# Patient Record
Sex: Female | Born: 2000
Health system: Southern US, Community
[De-identification: ages and names within clinical notes are randomized; demographics above are authoritative.]

## PROBLEM LIST (undated history)

## (undated) ENCOUNTER — Emergency Department (HOSPITAL_COMMUNITY): Payer: Self-pay | Source: Home / Self Care

## (undated) DIAGNOSIS — R519 Headache, unspecified: Secondary | ICD-10-CM

## (undated) DIAGNOSIS — F32A Depression, unspecified: Secondary | ICD-10-CM

## (undated) DIAGNOSIS — F938 Other childhood emotional disorders: Secondary | ICD-10-CM

## (undated) DIAGNOSIS — F988 Other specified behavioral and emotional disorders with onset usually occurring in childhood and adolescence: Secondary | ICD-10-CM

## (undated) DIAGNOSIS — J3089 Other allergic rhinitis: Secondary | ICD-10-CM

## (undated) DIAGNOSIS — T7840XA Allergy, unspecified, initial encounter: Secondary | ICD-10-CM

## (undated) DIAGNOSIS — R480 Dyslexia and alexia: Secondary | ICD-10-CM

## (undated) DIAGNOSIS — F329 Major depressive disorder, single episode, unspecified: Secondary | ICD-10-CM

## (undated) DIAGNOSIS — R51 Headache: Secondary | ICD-10-CM

## (undated) DIAGNOSIS — G4700 Insomnia, unspecified: Secondary | ICD-10-CM

## (undated) DIAGNOSIS — J45909 Unspecified asthma, uncomplicated: Secondary | ICD-10-CM

## (undated) DIAGNOSIS — R55 Syncope and collapse: Secondary | ICD-10-CM

## (undated) HISTORY — DX: Unspecified asthma, uncomplicated: J45.909

## (undated) HISTORY — DX: Other allergic rhinitis: J30.89

## (undated) HISTORY — DX: Headache, unspecified: R51.9

## (undated) HISTORY — DX: Headache: R51

## (undated) HISTORY — DX: Dyslexia and alexia: R48.0

## (undated) HISTORY — DX: Syncope and collapse: R55

## (undated) HISTORY — DX: Allergy, unspecified, initial encounter: T78.40XA

## (undated) HISTORY — PX: ADENOIDECTOMY: SHX5191

---
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
## 2019-01-08 ENCOUNTER — Ambulatory Visit (INDEPENDENT_AMBULATORY_CARE_PROVIDER_SITE_OTHER): Payer: PPO | Admitting: Family

## 2019-01-08 ENCOUNTER — Encounter: Payer: Self-pay | Admitting: Family

## 2019-01-08 ENCOUNTER — Other Ambulatory Visit: Payer: Self-pay

## 2019-01-08 DIAGNOSIS — F332 Major depressive disorder, recurrent severe without psychotic features: Secondary | ICD-10-CM

## 2019-01-08 DIAGNOSIS — F411 Generalized anxiety disorder: Secondary | ICD-10-CM

## 2019-01-08 DIAGNOSIS — Z79899 Other long term (current) drug therapy: Secondary | ICD-10-CM

## 2019-01-08 DIAGNOSIS — Z719 Counseling, unspecified: Secondary | ICD-10-CM | POA: Diagnosis not present

## 2019-01-08 DIAGNOSIS — F902 Attention-deficit hyperactivity disorder, combined type: Secondary | ICD-10-CM | POA: Diagnosis not present

## 2019-01-08 DIAGNOSIS — F5104 Psychophysiologic insomnia: Secondary | ICD-10-CM

## 2019-01-08 DIAGNOSIS — Z7189 Other specified counseling: Secondary | ICD-10-CM | POA: Diagnosis not present

## 2019-01-08 MED ORDER — FLUOXETINE HCL 20 MG PO TABS: 20.0000 mg | ORAL_TABLET | Freq: Every day | ORAL | 2 refills | Status: DC

## 2019-01-08 MED ORDER — AMPHETAMINE SULFATE 10 MG PO TABS: 20.0000 mg | ORAL_TABLET | Freq: Two times a day (BID) | ORAL | 0 refills | Status: DC

## 2019-01-08 NOTE — Progress Notes (Signed)
Patient ID: Deborah Cline, female   DOB: 06/06/2001, 18 y.o.   MRN: [id number]  [facility] [address], [place]. [address] [place] [address] Dept: [phone] Dept Fax: [phone]  Medication Check visit via Virtual Video due to COVID-19  Patient ID:  Deborah Cline  female DOB: May 02, 2001   18 y.o.   MRN: [id number]   DATE:01/08/19  PCP: Eliberto Ivory, MD  Virtual Visit via Video Note  I connected with patient @ [phone] patient's cell number  (Name Deborah Cline) on 01/08/19 at  2:00 PM EDT by a video enabled telemedicine application and verified that I am speaking with the correct person using two identifiers.   I discussed the limitations of evaluation and management by telemedicine and the availability of in person appointments. The patient/parent expressed understanding and agreed to proceed.  Parent Location: at home Provider Locations: [address], [place], [place]  HISTORY/CURRENT STATUS: Deborah Cline is here for medication management of the psychoactive medications for ADHD and review of educational and behavioral concerns.   Deborah Cline currently taking Evekeo and Prozac  which is working well. Takes medication at 11-12 pm. Medication tends to wear off around 10 pm. Deborah Cline is able to focus through homework.   Deborah Cline is eating well (eating breakfast, lunch and dinner). Eating has been up and down.   Not Sleeping well (goes to bed at 2 am wakes every few hours and gets up at 11:50 am), irregular sleep pattern.   Deborah Cline denies thoughts of hurting self or others, denies depression, anxiety, or fears. Prozac 20 mg daily,   EDUCATION: School: [employer] Year/Grade: Freshman  Performance/ Grades: average Services: Other: Disability services  Deborah Cline is currently out of school due to social distancing due to COVID-19. Online schooling now and not working due to  limiting her social interactions.   Activities/ Exercise: intermittently-walking, exercise with yoga in the morning  Screen time: (phone, tablet, TV, computer): Increased amount of time watching.   MEDICAL HISTORY: Individual Medical History/ Review of Systems: Changes? :None reported recently  Family Medical/ Social History: Changes? None reported by patient.   Current Medications:  Outpatient Encounter Medications as of 01/08/2019  Medication Sig   albuterol (PROVENTIL) (2.5 MG/3ML) 0.083% nebulizer solution Take 2.5 mg by nebulization every 6 (six) hours as needed for wheezing or shortness of breath.   Amphetamine Sulfate (EVEKEO) 10 MG TABS Take 20 mg by mouth 2 (two) times daily.   fexofenadine-pseudoephedrine (ALLEGRA-D) 60-120 MG 12 hr tablet Take 1 tablet by mouth 2 (two) times daily.   FLUoxetine (PROZAC) 20 MG tablet Take 1 tablet (20 mg total) by mouth daily.   NUVARING 0.12-0.015 MG/24HR vaginal ring USE ONE ring every THREE WEEKS, AND THEN REMOVE FOR ONE WEEK   traZODone (DESYREL) 100 MG tablet TAKE 2 TABLETS (200 MG TOTAL) BY MOUTH AT BEDTIME.   [DISCONTINUED] Amphetamine Sulfate (EVEKEO) 10 MG TABS Take 20 mg by mouth 2 (two) times daily.   [DISCONTINUED] FLUoxetine (PROZAC) 20 MG tablet Take 1 tablet (20 mg total) by mouth daily.   No facility-administered encounter medications on file as of 01/08/2019.    Medication Side Effects: None  MENTAL HEALTH: Mental Health Issues:   Depression and Anxiety-Prozac 20 mg daily with no side effects.   DIAGNOSES:    ICD-10-CM   1. Medication management Z79.899   2. ADHD (attention deficit hyperactivity disorder), combined type F90.2 FLUoxetine (PROZAC) 20 MG tablet  3. Psychophysiological  insomnia F51.04 FLUoxetine (PROZAC) 20 MG tablet  4. Severe episode of recurrent major depressive disorder, without psychotic features (HCC) F33.2 FLUoxetine (PROZAC) 20 MG tablet  5. Patient counseled Z71.9   6. Goals of care,  counseling/discussion Z71.89   7. Generalized anxiety disorder F41.1     RECOMMENDATIONS:  Discussed recent history and updates with patient since last visit today.   Discussed school academic progress and appropriate accommodations as needed for success.   Discussed continued need for routine, structure, motivation, reward and positive reinforcement with school and change in structure.   Encouraged recommended limitations on TV, tablets, phones, video games and computers for non-educational activities.   Encouraged physical activity and outdoor play, maintaining social distancing.   Referred to [employer] for resources about engaging children who are at home in home and online study.    Counseled medication pharmacokinetics, options, dosage, administration, desired effects, and possible side effects.   Evekeo 10 mg BID, # 60 with no RF's, Prozac 20 mg daily, # 30 with 2 RF's and continued Trazodone for sleep with no Rx.   I discussed the assessment and treatment plan with the patient/parent. The patient/parent was provided an opportunity to ask questions and all were answered. The patient/ parent agreed with the plan and demonstrated an understanding of the instructions.   I provided 25 minutes of non-face-to-face time during this encounter. Record review for 10 minutes before virtual video call.   NEXT APPOINTMENT:  Return in about 3 months (around 04/09/2019) for Follow up visit.  The patient/parent was advised to call back or seek an in-person evaluation if the symptoms worsen or if the condition fails to improve as anticipated.  Medical Decision-making: More than 50% of the appointment was spent counseling and discussing diagnosis and management of symptoms with the patient and family.  Carron Curie, NP

## 2019-02-28 IMAGING — MR MR HEAD W/O CM
10 of 12 series · 33 of 48 positions shown · non-contrast
Comparison: None.

CLINICAL DATA: Headache and syncope.  Abnormal EEG

EXAM:
MRI HEAD WITHOUT CONTRAST
TECHNIQUE: Multiplanar, multiecho pulse sequences of the brain and surrounding
structures were obtained without intravenous contrast.

[Series 3: DWI · axial · 3.0mm · 0.94mm/px · z∈[-116,+27]mm · 7 of 100 slices shown (1 of 2)]
[im 1/100]
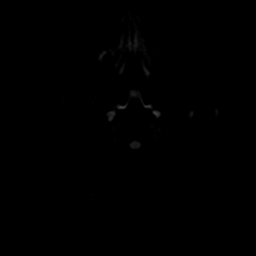
[im 17/100]
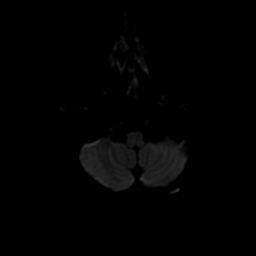
[im 34/100]
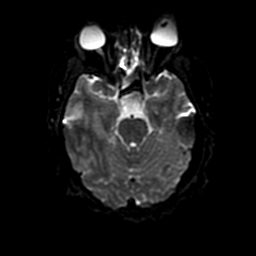
[im 50/100]
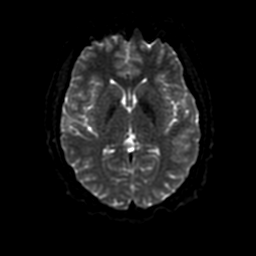
[im 67/100]
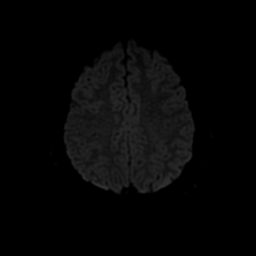
[im 83/100]
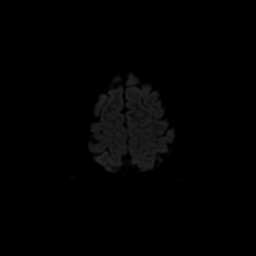
[im 100/100]
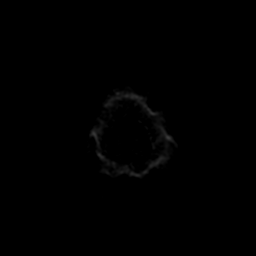

[Series 4: FLAIR · sagittal · 5.0mm · 0.47mm/px · 1 of 23 slices shown (1 of 2)]
[im 1/23]
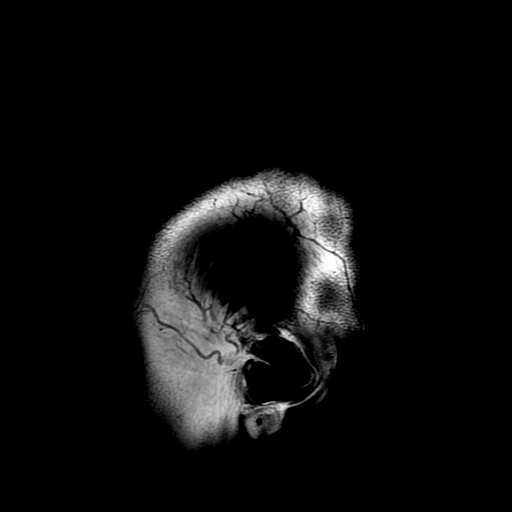

[Series 5: T2 · axial · 5.0mm · 0.47mm/px · z∈[-115,+25]mm · 2 of 25 slices shown (1 of 2)]
[im 1/25]
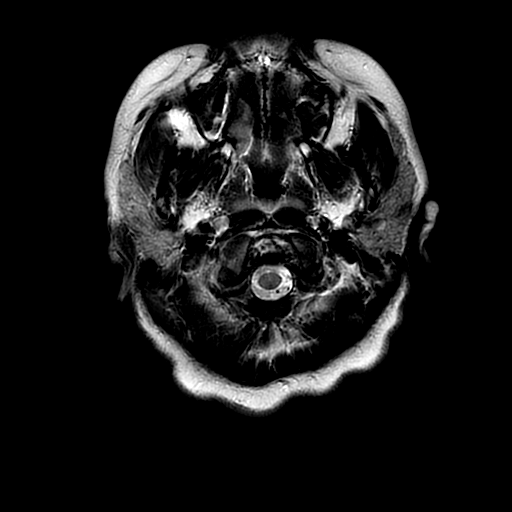
[im 25/25]
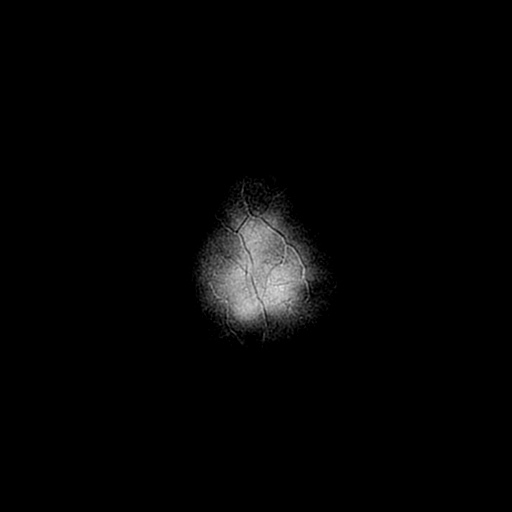

[Series 7: DWI · coronal · 4.0mm · 0.94mm/px · 5 of 72 slices shown (2 of 2)]
[im 1/72]
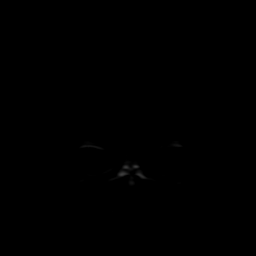
[im 18/72]
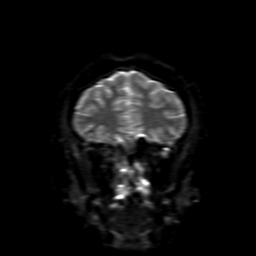
[im 36/72]
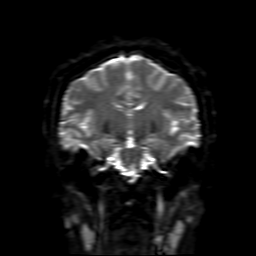
[im 54/72]
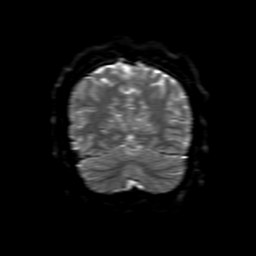
[im 72/72]
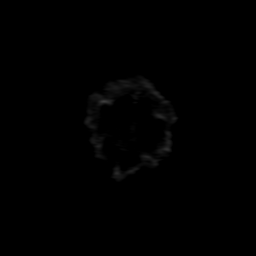

[Series 8: (person_name) · axial · 3.0mm · 0.47mm/px · z∈[-123,-41]mm · 5 of 100 slices shown]
[im 1/100]
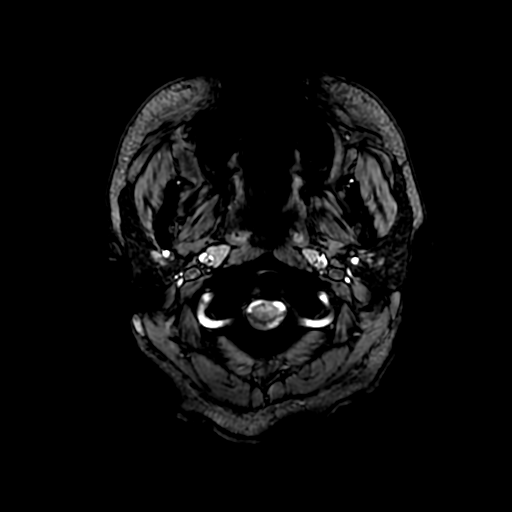
[im 15/100]
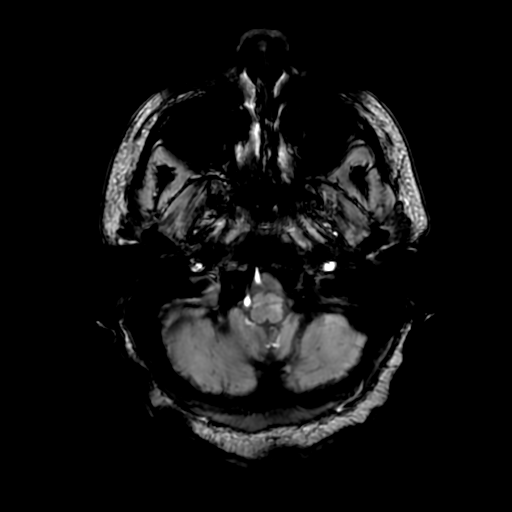
[im 29/100]
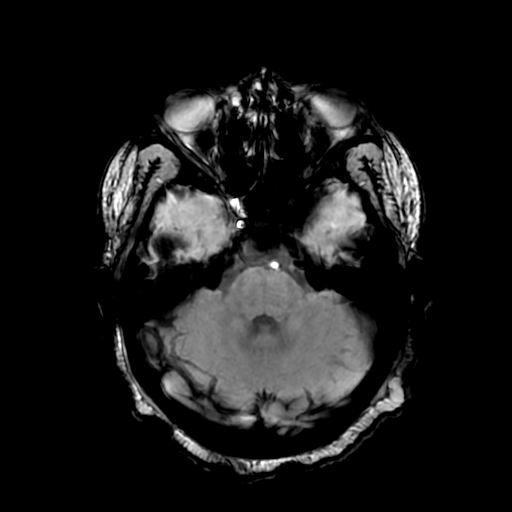
[im 43/100]
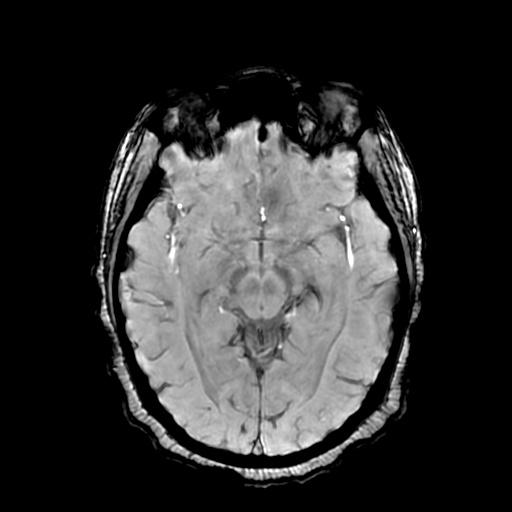
[im 57/100]
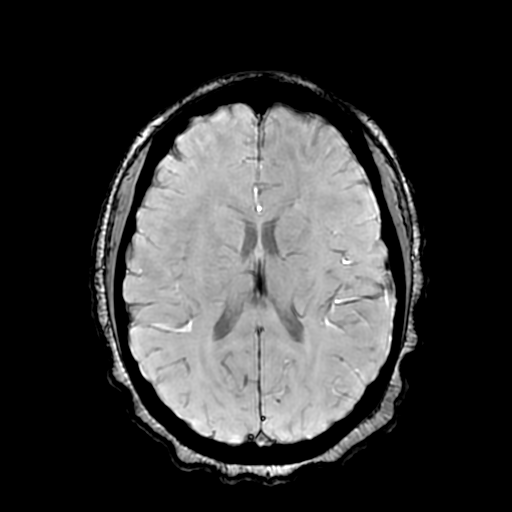

[Series 9: FLAIR · axial · 4.0mm · 0.45mm/px · z∈[-114,+25]mm · 2 of 27 slices shown (2 of 2)]
[im 1/27]
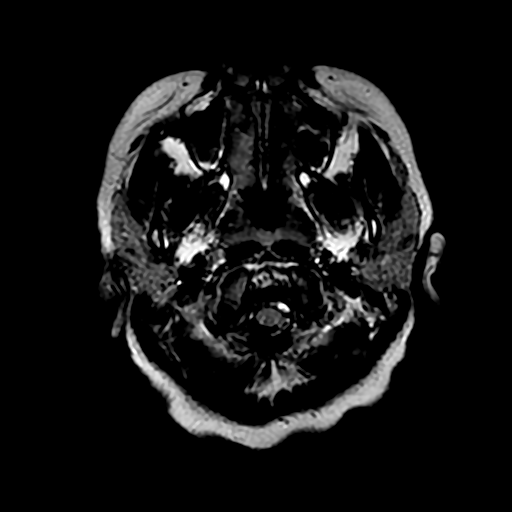
[im 27/27]
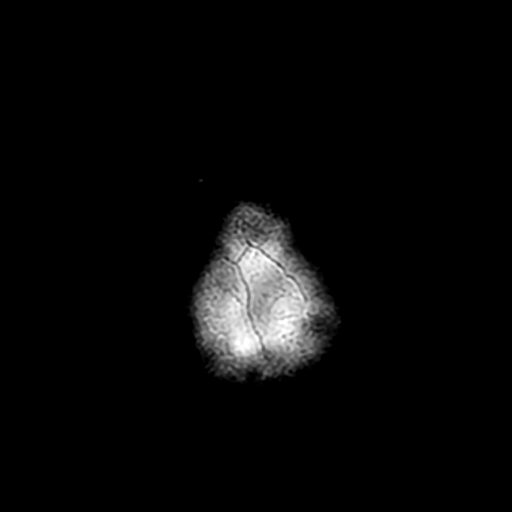

[Series 11: T2 fat-sat · coronal · 3.0mm · 0.43mm/px · 2 of 26 slices shown]
[im 1/26]
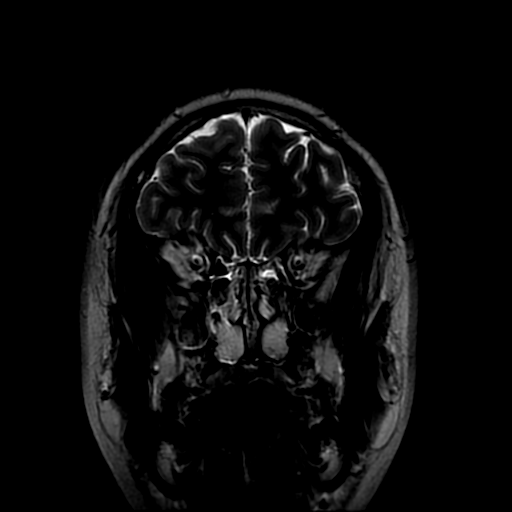
[im 26/26]
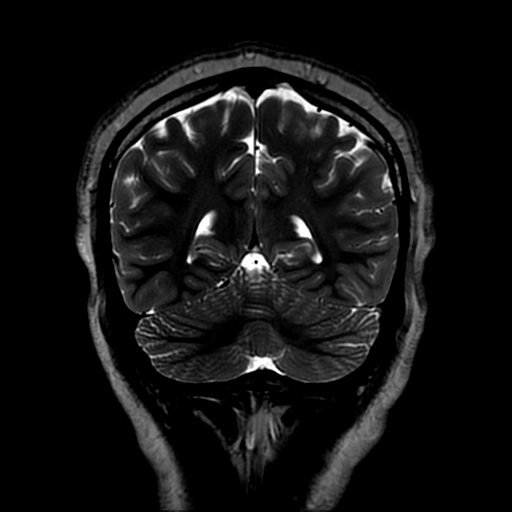

[Series 12: T2 · coronal · 5.0mm · 0.39mm/px · 2 of 30 slices shown (2 of 2)]
[im 1/30]
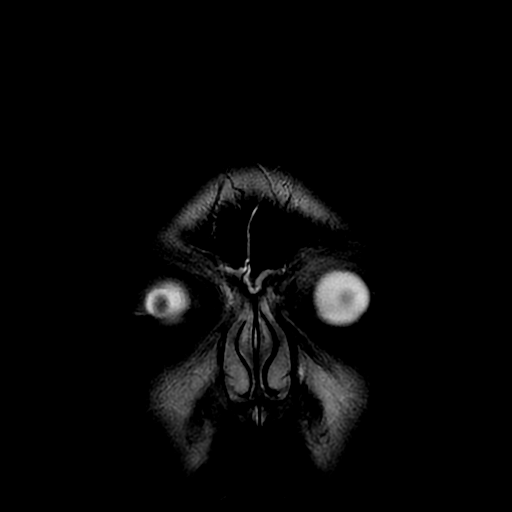
[im 30/30]
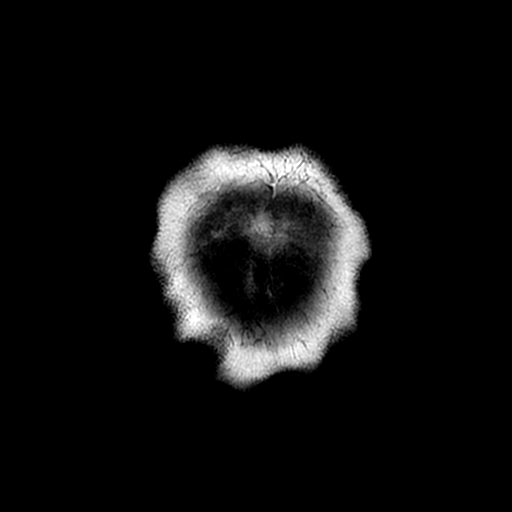

[Series 350: ADC · axial · 3.0mm · 0.94mm/px · z∈[-116,+27]mm · 4 of 50 slices shown (1 of 2)]
[im 1/50]
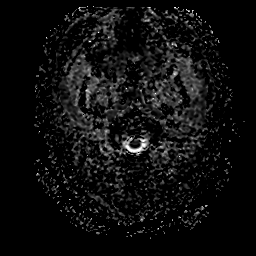
[im 17/50]
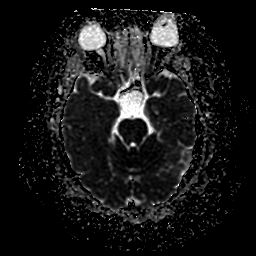
[im 33/50]
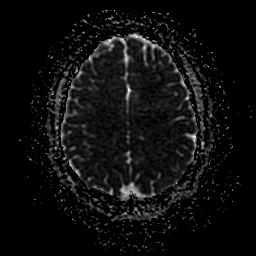
[im 50/50]
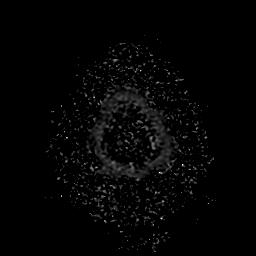

[Series 750: ADC · coronal · 4.0mm · 0.94mm/px · 3 of 36 slices shown (2 of 2)]
[im 1/36]
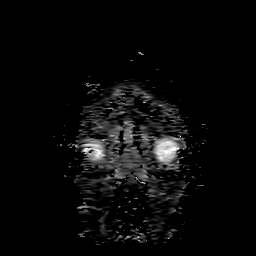
[im 18/36]
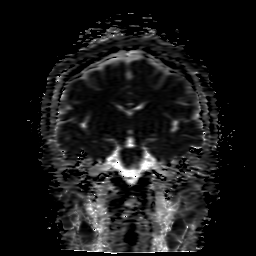
[im 36/36]
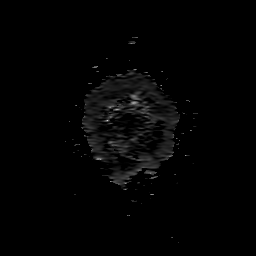

[33 of 48 positions shown; findings below may reference images not displayed]

FINDINGS: Brain: No acute infarction, hemorrhage, hydrocephalus, extra-axial
collection or mass lesion.

Vascular: Normal flow voids

Skull and upper cervical spine: Negative

Sinuses/Orbits: Moderate mucosal edema throughout the paranasal
sinuses without air-fluid level. Normal orbit.

Other: None
IMPRESSION: Normal MRI of the brain

Mucosal edema throughout the paranasal sinuses without air-fluid
level

## 2019-03-20 DIAGNOSIS — Z0100 Encounter for examination of eyes and vision without abnormal findings: Secondary | ICD-10-CM | POA: Diagnosis not present

## 2019-03-26 ENCOUNTER — Ambulatory Visit (INDEPENDENT_AMBULATORY_CARE_PROVIDER_SITE_OTHER): Payer: PPO

## 2019-03-26 ENCOUNTER — Ambulatory Visit (INDEPENDENT_AMBULATORY_CARE_PROVIDER_SITE_OTHER): Payer: PPO | Admitting: Orthopedic Surgery

## 2019-03-26 ENCOUNTER — Encounter: Payer: Self-pay | Admitting: Orthopedic Surgery

## 2019-03-26 ENCOUNTER — Other Ambulatory Visit: Payer: Self-pay

## 2019-03-26 VITALS — Ht 71.0 in | Wt 175.0 lb

## 2019-03-26 DIAGNOSIS — S93621A Sprain of tarsometatarsal ligament of right foot, initial encounter: Secondary | ICD-10-CM | POA: Diagnosis not present

## 2019-03-26 DIAGNOSIS — M79671 Pain in right foot: Secondary | ICD-10-CM | POA: Diagnosis not present

## 2019-03-26 NOTE — Progress Notes (Signed)
Office Visit Note   Patient: Deborah Cline           Date of Birth: January 23, 2001           MRN: [id number] Visit Date: 03/26/2019              Requested by: Elnita Maxwell, MD [address], [address] [place],  [place] [address] PCP: Elnita Maxwell, MD  Chief Complaint  Patient presents with  . Right Foot - Pain    DOI 03/26/2019 kicking punching bag.       HPI: Patient is a 18 year old woman who presents with pain right midfoot.  She states she was kicking a punching bag with her foot plantarflexed striking the middle of her foot she complains of pain when she woke up in the morning injury occurred last night.  Complains of ecchymosis and bruising on the plantar aspect of the midfoot.  Assessment & Plan: Visit Diagnoses:  1. Pain in right foot   2. Lisfranc's sprain, right, initial encounter     Plan: Patient was placed in a fracture boot left a message with her mother that she has a sprain Lisfranc complex and this should resolve with immobilization.  Reevaluate in 3 weeks with three-view radiographs of the right foot.  Follow-Up Instructions: Return in about 3 weeks (around 04/16/2019).   Ortho Exam  Patient is alert, oriented, no adenopathy, well-dressed, normal affect, normal respiratory effort. Examination patient has good pulses she has ecchymosis and bruising on the plantar aspect of the foot.  Distraction across the Lisfranc complex is painful she is tender to palpation over the Lisfranc joint distraction across the metatarsal heads is not tender.  She has good inversion eversion plantarflexion and dorsiflexion strength.  Imaging: Xr Foot 2 Views Right  Result Date: 03/26/2019 2 view radiographs of the right foot shows no evidence of a fracture.  There is no evulsion in the Lisfranc complex no widening no incongruity of the joint spaces.  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  No images are attached to the encounter.  Labs:  No results found for: HGBA1C, ESRSEDRATE, CRP, LABURIC, REPTSTATUS, GRAMSTAIN, CULT, LABORGA   Lab Results  Component Value Date   ALBUMIN 5.0 07/06/2015    Body mass index is 24.41 kg/m.  Orders:  Orders Placed This Encounter  Procedures  . XR Foot 2 Views Right   No orders of the defined types were placed in this encounter.    Procedures: No procedures performed  Clinical Data: No additional findings.  ROS:  All other systems negative, except as noted in the HPI. Review of Systems  Objective: Vital Signs: Ht 5\' 11"  (1.803 m)   Wt 175 lb (79.4 kg)   BMI 24.41 kg/m   Specialty Comments:  No specialty comments available.  PMFS History: Patient Active Problem List   Diagnosis Date Noted  . Abnormal EEG 03/13/2017  . Vasovagal syncope 02/21/2017  . Migraine without aura and without status migrainosus, not intractable 02/21/2017  . Achilles tendinitis, left leg 08/23/2016  . ADHD (attention deficit hyperactivity disorder), combined type 08/17/2015  . MDD (major depressive disorder), recurrent episode, severe ([place]) 07/07/2015  . 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 Insomnia 07/07/2015   Past Medical History:  Diagnosis Date  . ADD (attention deficit disorder)   . 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 Asthma   . Depression   . Dyslexia   . Environmental and seasonal allergies   . Headache   .  Insomnia 07/07/2015  . Syncope and collapse     Family History  Problem Relation Age of Onset  . Mental illness Maternal Aunt   . Depression Mother     Past Surgical History:  Procedure Laterality Date  . ADENOIDECTOMY     Social History   Occupational History  . Not on file  Tobacco Use  . Smoking status: Never Smoker  . Smokeless tobacco: Never Used  Substance and Sexual Activity  . Alcohol use: No  . Drug use: No  . Sexual activity: Never    Partners: Male    Birth control/protection: Other-see comments    Comment: Nexplanon

## 2019-03-31 ENCOUNTER — Other Ambulatory Visit: Payer: Self-pay

## 2019-03-31 ENCOUNTER — Ambulatory Visit (INDEPENDENT_AMBULATORY_CARE_PROVIDER_SITE_OTHER): Payer: 59 | Admitting: Family

## 2019-03-31 ENCOUNTER — Encounter: Payer: Self-pay | Admitting: Family

## 2019-03-31 DIAGNOSIS — Z719 Counseling, unspecified: Secondary | ICD-10-CM | POA: Diagnosis not present

## 2019-03-31 DIAGNOSIS — F902 Attention-deficit hyperactivity disorder, combined type: Secondary | ICD-10-CM | POA: Diagnosis not present

## 2019-03-31 DIAGNOSIS — F332 Major depressive disorder, recurrent severe without psychotic features: Secondary | ICD-10-CM

## 2019-03-31 DIAGNOSIS — Z8659 Personal history of other mental and behavioral disorders: Secondary | ICD-10-CM

## 2019-03-31 DIAGNOSIS — Z79899 Other long term (current) drug therapy: Secondary | ICD-10-CM | POA: Diagnosis not present

## 2019-03-31 DIAGNOSIS — F5104 Psychophysiologic insomnia: Secondary | ICD-10-CM | POA: Diagnosis not present

## 2019-03-31 DIAGNOSIS — G43009 Migraine without aura, not intractable, without status migrainosus: Secondary | ICD-10-CM | POA: Diagnosis not present

## 2019-03-31 MED ORDER — FLUOXETINE HCL 20 MG PO TABS: 20.0000 mg | ORAL_TABLET | Freq: Every day | ORAL | 2 refills | Status: DC

## 2019-03-31 NOTE — Progress Notes (Signed)
[facility]. [address] [place] [address] Dept: [phone] Dept Fax: [phone]  Medication Check visit via Virtual Video due to COVID-19  Patient ID:  Deborah Cline  female DOB: [birth year]   18 y.o.   MRN: [id number]   DATE:03/31/19  PCP: Elnita Maxwell, MD   Virtual Visit via Telephone Note Contacted  Joleen Stuckert on 03/31/19 at  3:00 PM EDT by telephone and verified that I am speaking with the correct person using two identifiers. Patient Location: at home   I discussed the limitations, risks, security and privacy concerns of performing an evaluation and management service by telephone and the availability of in person appointments. I also discussed with the parents that there may be a patient responsible charge related to this service. The parents expressed understanding and agreed to proceed.  Provider: Carolann Littler, NP  Location: work location  HISTORY/CURRENT STATUS: Deborah Cline is here for medication management of the psychoactive medications for ADHD and review of educational and behavioral concerns.   Arnelle currently taking Evekeo and Prozac, which is working well. Takes medication when she wakes up about 10-1:00 pm. Medication tends to wear off around 9-10:00 pm. Keali is able to focus through school/homework.   Eh is eating well (eating breakfast, lunch and dinner).   Sleeping well (goes to bed some time after 1:00 am wakes at 10-1:00 am), sleeping through the night using Trazadone.   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   Activities/ Exercise: intermittently-some walking in the neighborhood.  Screen time: (phone, tablet, TV, computer):  TV, computer, movies, and phone.  MEDICAL HISTORY: Individual Medical History/ Review of Systems: Changes? :None recently  Family Medical/ Social History: Changes? None reported recently Patient Lives with: mother and stepfather  Current Medications:  Outpatient Encounter Medications as of 03/31/2019  Medication Sig   albuterol (PROVENTIL) (2.5 MG/3ML) 0.083% nebulizer solution Take 2.5 mg by nebulization every 6 (six) hours as needed for wheezing or shortness of breath.   Amphetamine Sulfate (EVEKEO) 10 MG TABS Take 20 mg by mouth 2 (two) times daily.   fexofenadine-pseudoephedrine (ALLEGRA-D) 60-120 MG 12 hr tablet Take 1 tablet by mouth 2 (two) times daily.   FLUoxetine (PROZAC) 20 MG tablet Take 1-2 tablets (20-40 mg total) by mouth daily.   NUVARING 0.12-0.015 MG/24HR vaginal ring USE ONE ring every THREE WEEKS, AND THEN REMOVE FOR ONE WEEK   traZODone (DESYREL) 100 MG tablet TAKE 2 TABLETS (200 MG TOTAL) BY MOUTH AT BEDTIME.   [DISCONTINUED] FLUoxetine (PROZAC) 20 MG tablet Take 1 tablet (20 mg total) by mouth daily.   No facility-administered encounter medications on file as of 03/31/2019.    Medication Side Effects: None  MENTAL HEALTH: Mental Health Issues:   Depression and Anxiety-Prozac  DIAGNOSES:    ICD-10-CM   1. ADHD (attention deficit hyperactivity disorder), combined type  F90.2 FLUoxetine (PROZAC) 20 MG tablet  2. Psychophysiological insomnia  F51.04 FLUoxetine (PROZAC) 20 MG tablet  3. Severe episode of recurrent major depressive disorder, without psychotic features ([place])  F33.2 FLUoxetine (PROZAC) 20 MG tablet  4. Medication management  Z79.899   5. Patient counseled  Z71.9   6. History of anxiety  Z86.59   7. Migraine without aura and without status migrainosus, not intractable  G43.009  RECOMMENDATIONS:  Discussed recent history with patient with updates for learning, school and work with patient since last f/u visit.   Discussed school academic  progress and recommended continued summer academic home school activities.   Discussed continued need for routine, structure, motivation, reward and positive reinforcement with home and school environments.   Encouraged recommended limitations on TV, tablets, phones, video games and computers for non-educational activities.   Discussed need for bedtime routine, use of good sleep hygiene, no video games, TV or phones for an hour before bedtime.   Encouraged physical activity and outdoor play, maintaining social distancing.   Counseled medication pharmacokinetics, options, dosage, administration, desired effects, and possible side effects.   Prozac to increase from 20 mg tablets to 30 mg and then 40 mg if needed, # 60 with 2 RF's. Trazadone to continue with no RF's and Evekeo with no Rx today. RX for above e-scribed and sent to pharmacy on record  [id number] [place] - [address] [place] [id number] Phone: [phone] Fax: [phone]  I discussed the assessment and treatment plan with the patient. The patient was provided an opportunity to ask questions and all were answered. The patient agreed with the plan and demonstrated an understanding of the instructions.   I provided 35 minutes of non-face-to-face time during this encounter. Completed record review for 10 minutes prior to the virtual telemedicine visit.   NEXT APPOINTMENT:  Return in about 3 months (around 07/01/2019) for follow up visit.  The patient was advised to call back or seek an in-person evaluation if the symptoms worsen or if the condition fails to improve as anticipated.  Medical Decision-making: More than 50% of the appointment was spent counseling and discussing diagnosis and management of symptoms with the patient and family.  Carron Curieawn M Paretta-Leahey, NP

## 2019-05-03 ENCOUNTER — Other Ambulatory Visit: Payer: Self-pay | Admitting: Family

## 2019-05-04 NOTE — Telephone Encounter (Signed)
Trazadone 100 mg 2 daily, # 60 with 2 RF's. RX for above e-scribed and sent to pharmacy on record  [id number] [place], [place] - [address] [place] [address] Phone: [phone] Fax: [phone]

## 2019-07-06 ENCOUNTER — Ambulatory Visit (INDEPENDENT_AMBULATORY_CARE_PROVIDER_SITE_OTHER): Payer: Medicaid Other | Admitting: Family

## 2019-07-06 ENCOUNTER — Encounter: Payer: Self-pay | Admitting: Family

## 2019-07-06 DIAGNOSIS — Z719 Counseling, unspecified: Secondary | ICD-10-CM

## 2019-07-06 DIAGNOSIS — F411 Generalized anxiety disorder: Secondary | ICD-10-CM | POA: Diagnosis not present

## 2019-07-06 DIAGNOSIS — G43009 Migraine without aura, not intractable, without status migrainosus: Secondary | ICD-10-CM

## 2019-07-06 DIAGNOSIS — F332 Major depressive disorder, recurrent severe without psychotic features: Secondary | ICD-10-CM

## 2019-07-06 DIAGNOSIS — F902 Attention-deficit hyperactivity disorder, combined type: Secondary | ICD-10-CM | POA: Diagnosis not present

## 2019-07-06 DIAGNOSIS — F5104 Psychophysiologic insomnia: Secondary | ICD-10-CM

## 2019-07-06 DIAGNOSIS — Z79899 Other long term (current) drug therapy: Secondary | ICD-10-CM | POA: Diagnosis not present

## 2019-07-06 MED ORDER — TRAZODONE HCL 100 MG PO TABS: 200.0000 mg | ORAL_TABLET | Freq: Every day | ORAL | 2 refills | Status: DC

## 2019-07-06 MED ORDER — FLUOXETINE HCL 20 MG PO TABS: 20.0000 mg | ORAL_TABLET | Freq: Every day | ORAL | 2 refills | Status: DC

## 2019-07-06 NOTE — Progress Notes (Signed)
[facility] [place] [address] Dept: [phone] Dept Fax: [phone]  Medication Check visit via Virtual Video due to COVID-19  Patient ID:  Deborah Cline  female DOB: 2001-05-12   18 y.o.   MRN: [id number]   DATE:07/06/19  PCP: Elnita Maxwell, MD  Virtual Visit via Video Note  I connected with  Gwynn Burly on 07/06/19 at 10:00 AM EDT by a video enabled telemedicine application and verified that I am speaking with the correct person using two identifiers. Patient/Parent Location: at her friend's house   I discussed the limitations, risks, security and privacy concerns of performing an evaluation and management service by telephone and the availability of in person appointments. I also discussed with the parents that there may be a patient responsible charge related to this service. The parents expressed understanding and agreed to proceed.  Provider: Carolann Littler, NP  Location: private location  HISTORY/CURRENT STATUS: Deborah Cline is here for medication management of the psychoactive medications for ADHD and review of educational and behavioral concerns.   Deborah Cline currently taking Trazadone and Prozac,  which is working well. Takes medication as directed. Medication tends to last for the time needed. [name] is able to focus through work.   Deborah Cline is eating well (eating breakfast, lunch and dinner). Eating well with no issues, but recently had changes in appetite with 3 loses in a short period of time.    Sleeping well (goes to bed at 12:00 am wakes at 2:00 pm), sleeping through the night. Waking at different times of the night and stays up for a while then will go back to sleep. Staying in bed most of the day with nothing to do for the day.   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   Activities/ Exercise: limited exercise.   Screen time: (phone, tablet, TV, computer): phone, TV, movies, computer.  MEDICAL HISTORY: Individual Medical History/ Review of Systems: Changes? :No, none recently reported. Needs new PCP, and dental visit recently.   Family Medical/ Social History: Changes? None reported recently Patient Lives with: mother and stepfather, visits father who lives in [place].  Current Medications:  Outpatient Encounter Medications as of 07/06/2019  Medication Sig  . albuterol (PROVENTIL) (2.5 MG/3ML) 0.083% nebulizer solution Take 2.5 mg by nebulization every 6 (six) hours as needed for wheezing or shortness of breath.  . Amphetamine Sulfate (EVEKEO) 10 MG TABS Take 20 mg by mouth 2 (two) times daily.  . fexofenadine-pseudoephedrine (ALLEGRA-D) 60-120 MG 12 hr tablet Take 1 tablet by mouth 2 (two) times daily.  Marland Kitchen FLUoxetine (PROZAC) 20 MG tablet Take 1-2 tablets (20-40 mg total) by mouth daily.  Marland Kitchen NUVARING 0.12-0.015 MG/24HR vaginal ring USE ONE ring every THREE WEEKS, AND THEN REMOVE FOR ONE WEEK  . traZODone (DESYREL) 100 MG tablet Take 2 tablets (200 mg total) by mouth at bedtime.  . [DISCONTINUED] FLUoxetine (PROZAC) 20 MG tablet Take 1-2 tablets (20-40 mg total) by mouth daily.  . [DISCONTINUED] traZODone (DESYREL) 100 MG tablet TAKE 2 TABLETS (200 MG TOTAL) BY MOUTH AT BEDTIME.   No facility-administered encounter medications on file as of 07/06/2019.    Medication Side Effects: None  MENTAL HEALTH: Mental Health  Issues:   Depression and Anxiety  Better with medication.   DIAGNOSES:    ICD-10-CM   1. ADHD (attention deficit hyperactivity disorder), combined type  F90.2 FLUoxetine (PROZAC) 20 MG tablet  2. Psychophysiological insomnia  F51.04  FLUoxetine (PROZAC) 20 MG tablet  3. Severe episode of recurrent major depressive disorder, without psychotic features (HCC)  F33.2 FLUoxetine (PROZAC) 20 MG tablet  4. Migraine without aura and without status migrainosus, not intractable  G43.009   5. Generalized anxiety disorder  F41.1   6. Medication management  Z79.899   7. Patient counseled  Z71.9     RECOMMENDATIONS:  Discussed recent history with patient with updates for school, learning, health, recent loses in the family, and medications.   Discussed school academic progress and recommended continued accommodations for the new school year. To restart school next semester at [employer].   Referred to ADDitudemag.com for resources about using distance learning with children with ADHD with learning support.   Children and young adults with ADHD often suffer from disorganization, difficulty with time management, completing projects and other executive function difficulties.  Recommended Reading: "Smart but Scattered" and "Smart but Scattered Teens" by Peg Arita Miss and Marjo Bicker.    Discussed continued need for structure, routine, reward (external), motivation (internal), positive reinforcement, consequences, and organization with home and school life balance.   Encouraged recommended limitations on TV, tablets, phones, video games and computers for non-educational activities.   Discussed need for bedtime routine, use of good sleep hygiene, no video games, TV or phones for an hour before bedtime.   Encouraged physical activity and outdoor play, maintaining social distancing.   Counseled medication pharmacokinetics, options, dosage, administration, desired effects, and possible side effects.   Trazadone 100 mg 2 at HS, # 60 with 2 RF's Prozac 20 mg 2 daily, # 60 with no RF's RX for above e-scribed and sent to pharmacy on record  [id number] [place], [place] - [address] [place] [address] Phone:  [phone] Fax: [phone]  I discussed the assessment and treatment plan with the patient. The patient was provided an opportunity to ask questions and all were answered. The patient agreed with the plan and demonstrated an understanding of the instructions.   I provided 35 minutes of non-face-to-face time during this encounter. Completed record review for 10 minutes prior to the virtual video visit.   NEXT APPOINTMENT:  Return in about 3 months (around 10/05/2019) for follow up visit in the office.  The patient was advised to call back or seek an in-person evaluation if the symptoms worsen or if the condition fails to improve as anticipated.  Medical Decision-making: More than 50% of the appointment was spent counseling and discussing diagnosis and management of symptoms with the patient and family.  Carron Curie, NP

## 2019-08-28 ENCOUNTER — Other Ambulatory Visit: Payer: Self-pay

## 2019-08-28 DIAGNOSIS — Z20822 Contact with and (suspected) exposure to covid-19: Secondary | ICD-10-CM

## 2019-08-28 DIAGNOSIS — Z20828 Contact with and (suspected) exposure to other viral communicable diseases: Secondary | ICD-10-CM | POA: Diagnosis not present

## 2019-08-31 LAB — NOVEL CORONAVIRUS, NAA: SARS-CoV-2, NAA: NOT DETECTED

## 2019-09-16 ENCOUNTER — Ambulatory Visit (INDEPENDENT_AMBULATORY_CARE_PROVIDER_SITE_OTHER): Payer: Medicaid Other | Admitting: Certified Nurse Midwife

## 2019-09-16 ENCOUNTER — Other Ambulatory Visit (HOSPITAL_COMMUNITY)
Admission: RE | Admit: 2019-09-16 | Discharge: 2019-09-16 | Disposition: A | Discharge status: Home / Self Care | Payer: Medicaid Other | Source: Ambulatory Visit | Attending: Obstetrics and Gynecology | Admitting: Obstetrics and Gynecology

## 2019-09-16 ENCOUNTER — Other Ambulatory Visit: Payer: Self-pay

## 2019-09-16 ENCOUNTER — Encounter: Payer: Self-pay | Admitting: Certified Nurse Midwife

## 2019-09-16 ENCOUNTER — Telehealth: Payer: Self-pay

## 2019-09-16 VITALS — BP 112/68 | HR 90 | Temp 98.0°F | Ht 71.0 in | Wt 169.6 lb

## 2019-09-16 DIAGNOSIS — Z3049 Encounter for surveillance of other contraceptives: Secondary | ICD-10-CM

## 2019-09-16 DIAGNOSIS — Z23 Encounter for immunization: Secondary | ICD-10-CM | POA: Diagnosis not present

## 2019-09-16 DIAGNOSIS — R102 Pelvic and perineal pain: Secondary | ICD-10-CM

## 2019-09-16 DIAGNOSIS — Z0000 Encounter for general adult medical examination without abnormal findings: Secondary | ICD-10-CM | POA: Diagnosis not present

## 2019-09-16 DIAGNOSIS — Z01419 Encounter for gynecological examination (general) (routine) without abnormal findings: Secondary | ICD-10-CM

## 2019-09-16 DIAGNOSIS — Z113 Encounter for screening for infections with a predominantly sexual mode of transmission: Secondary | ICD-10-CM | POA: Insufficient documentation

## 2019-09-16 MED ORDER — NUVARING 0.12-0.015 MG/24HR VA RING: VAGINAL_RING | VAGINAL | 3 refills | Status: DC

## 2019-09-16 NOTE — Patient Instructions (Signed)
Safe Sex Practicing safe sex means taking steps before and during sex to reduce your risk of:  Getting an STI (sexually transmitted infection).  Giving your partner an STI.  Unwanted or unplanned pregnancy.  Ways you can practice safe sex  Limit your sexual partners to only one partner who is having sex with only you.  Avoid using alcohol and drugs before having sex. Alcohol and drugs can affect your judgment.  Before having sex with a new partner: ? Talk to your partner about past partners, past STIs, and drug use. ? Get screened for STIs and discuss the results with your partner. Ask your partner to get screened, too.  Check your body regularly for sores, blisters, rashes, or unusual discharge. If you notice any of these problems, visit your health care provider.  Avoid sexual contact if you have symptoms of an infection or you are being treated for an STI.  While having sex, use a condom. Make sure to: ? Use a condom every time you have vaginal, oral, or anal sex. Both females and males should wear condoms during oral sex. ? Keep condoms in place from the beginning to the end of sexual activity. ? Use a latex condom, if possible. Latex condoms offer the best protection. ? Use only water-based lubricants with a condom. Using petroleum-based lubricants or oils will weaken the condom and increase the chance that it will break. Ways your health care provider can help you practice safe sex  See your health care provider for regular screenings, exams, and tests for STIs.  Talk with your health care provider about what kind of birth control (contraception) is best for you.  Get vaccinated against hepatitis B and human papillomavirus (HPV).  If you are at risk of being infected with HIV (human immunodeficiency virus), talk with your health care provider about taking a prescription medicine to prevent HIV infection. You are at risk for HIV if you: ? Are a man who has sex with other  men. ? Are sexually active with more than one partner. ? Take drugs by injection. ? Have a sex partner who has HIV. ? Have unprotected sex. ? Have sex with someone who has sex with both men and women. ? Have had an STI. Follow these instructions at home:  Take over-the-counter and prescription medicines as told by your health care provider.  Keep all follow-up visits as told by your health care provider. This is important. Where to find more information  Centers for Disease Control and Prevention: PinkCheek.be  Planned Parenthood: https://www.plannedparenthood.org/  Office on Women's Health: BasketballVoice.it Summary  Practicing safe sex means taking steps before and during sex to reduce your risk of STIs, giving your partner STIs, and having an unwanted or unplanned pregnancy.  Before having sex with a new partner, talk to your partner about past partners, past STIs, and drug use.  Use a condom every time you have vaginal, oral, or anal sex. Both females and males should wear condoms during oral sex.  Check your body regularly for sores, blisters, rashes, or unusual discharge. If you notice any of these problems, visit your health care provider.  See your health care provider for regular screenings, exams, and tests for STIs. This information is not intended to replace advice given to you by your health care provider. Make sure you discuss any questions you have with your health care provider. Document Released: 11/01/2004 Document Revised: 01/16/2019 Document Reviewed: 07/07/2018 Elsevier Patient Education  2020 [employer].

## 2019-09-16 NOTE — Progress Notes (Signed)
GYNECOLOGY ANNUAL PREVENTATIVE CARE ENCOUNTER NOTE  History:     Deborah Cline is a 18 y.o. G0P0000 female here for a routine annual gynecologic exam.  Current complaints: abdominal pain in pelvis that radiates up to left side near umbilicus.   Denies abnormal vaginal bleeding, discharge, problems with intercourse or other gynecologic concerns. Patient request screening for STDs.    Gynecologic History Patient's last menstrual period was 09/09/2019 (approximate). Contraception: NuvaRing vaginal inserts Last Pap: <21yo no pap needed at this time   Obstetric History OB History  Gravida Para Term Preterm AB Living  0 0 0 0 0 0  SAB TAB Ectopic Multiple Live Births  0 0 0 0 0  Obstetric Comments  Per patient, she does not interact with people very often.     Past Medical History:  Diagnosis Date  . ADD (attention deficit disorder)   . 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 Asthma   . Depression   . Dyslexia   . Environmental and seasonal allergies   . Headache   . Insomnia 07/07/2015  . Syncope and collapse     Past Surgical History:  Procedure Laterality Date  . ADENOIDECTOMY      Current Outpatient Medications on File Prior to Visit  Medication Sig Dispense Refill  . albuterol (PROVENTIL) (2.5 MG/3ML) 0.083% nebulizer solution Take 2.5 mg by nebulization every 6 (six) hours as needed for wheezing or shortness of breath.    . Amphetamine Sulfate (EVEKEO) 10 MG TABS Take 20 mg by mouth 2 (two) times daily. 120 tablet 0  . fexofenadine-pseudoephedrine (ALLEGRA-D) 60-120 MG 12 hr tablet Take 1 tablet by mouth 2 (two) times daily. 30 tablet 0  . FLUoxetine (PROZAC) 20 MG tablet Take 1-2 tablets (20-40 mg total) by mouth daily. 60 tablet 2  . traZODone (DESYREL) 100 MG tablet Take 2 tablets (200 mg total) by mouth at bedtime. 60 tablet 2   No current facility-administered medications on file prior to visit.     Allergies  Allergen Reactions  . Cantaloupe  (Diagnostic)   . Fish Allergy   . Peanut Butter Flavor     Social History:  reports that she has never smoked. She has never used smokeless tobacco. She reports that she does not drink alcohol or use drugs.  Family History  Problem Relation Age of Onset  . Mental illness Maternal Aunt   . Depression Mother     The following portions of the patient's history were reviewed and updated as appropriate: allergies, current medications, past family history, past medical history, past social history, past surgical history and problem list.  Review of Systems Pertinent items noted in HPI and remainder of comprehensive ROS otherwise negative.  Physical Exam:  BP 112/68 (BP Location: Right Arm, Patient Position: Sitting, Cuff Size: Normal)   Pulse 90   Temp 98 F (36.7 C) (Oral)   Ht 5\' 11"  (1.803 m)   Wt 169 lb 9.6 oz (76.9 kg)   LMP 09/09/2019 (Approximate)   BMI 23.65 kg/m  CONSTITUTIONAL: Well-developed, well-nourished female in no acute distress.  HENT:  Normocephalic, atraumatic, External right and left ear normal. Oropharynx is clear and moist EYES: Conjunctivae and EOM are normal. Pupils are equal, round, and reactive to light.  NECK: Normal range of motion, supple, no masses.  Normal thyroid.  SKIN: Skin is warm and dry. No rash noted. Not diaphoretic. No erythema. No pallor. MUSCULOSKELETAL: Normal range of motion. No tenderness.  No cyanosis, clubbing,  or edema.  2+ distal pulses. NEUROLOGIC: Alert and oriented to person, place, and time. Normal reflexes, muscle tone coordination.  PSYCHIATRIC: Normal mood and affect. Normal behavior. Normal judgment and thought content. CARDIOVASCULAR: Normal heart rate noted, regular rhythm RESPIRATORY: Clear to auscultation bilaterally. Effort and breath sounds normal, no problems with respiration noted. BREASTS: Symmetric in size. No masses, tenderness, skin changes, nipple drainage, or lymphadenopathy bilaterally. ABDOMEN: Soft, no  distention noted.  No tenderness, rebound or guarding.  PELVIC: Normal appearing external genitalia and urethral meatus;  No abnormal discharge noted.  Normal uterine size, no other palpable masses, no uterine or right adnexal tenderness. Left adnexal tenderness and pain present with palpation.    Assessment and Plan:    1. Women's annual routine gynecological examination - Abnormal well woman examination d/t finding of left adnexal tenderness and pain   2. Screening for STDs (sexually transmitted diseases) - Patient request screening for STDs - Cervicovaginal ancillary only( [place]) - HIV antibody (with reflex) - RPR - Hepatitis B Surface AntiGEN - Hepatitis C Antibody  3. Adnexal pain - Most likely ovarian cyst cause of adnexal pain and tenderness d/t method of birth control  - Patient reports pain is specific to left lower quadrant  - Patient denies having a [place] in the past for assessment, reports she was told she needed one at last annual but it did not get scheduled  - US Pelvis Complete; Future  4. Encounter for surveillance of nuvaring - No complaints or concerns with nuvaring - refill for one year sent to pharmacy  - NUVARING 0.12-0.015 MG/24HR vaginal ring; USE ONE ring every THREE WEEKS, AND THEN REMOVE FOR ONE WEEK  Dispense: 3 each; Refill: 3  5. Need for immunization against influenza - Flu Vaccine QUAD 36+ mos IM  Will follow up results of STD screening/US and manage accordingly. Routine preventative health maintenance measures emphasized. Please refer to After Visit Summary for other counseling recommendations.      Sharyon Cable, CNM [facility] [employer], [facility]

## 2019-09-16 NOTE — Telephone Encounter (Signed)
Tried to reach patient on 10/1 at 2:42pm, 10/14 at 1pm, 11/30 at 9:05am, 12/7 at 10:20am, and 12/9 at 12:02pm with no response to schedule 3 month follow up

## 2019-09-17 ENCOUNTER — Ambulatory Visit: Payer: Medicaid Other | Admitting: Obstetrics and Gynecology

## 2019-09-17 LAB — HIV ANTIBODY (ROUTINE TESTING W REFLEX): HIV Screen 4th Generation wRfx: NONREACTIVE

## 2019-09-17 LAB — RPR: RPR Ser Ql: NONREACTIVE

## 2019-09-17 LAB — HEPATITIS B SURFACE ANTIGEN: Hepatitis B Surface Ag: NEGATIVE

## 2019-09-17 LAB — HEPATITIS C ANTIBODY: Hep C Virus Ab: <0.1 s/co ratio (ref 0.0–0.9)

## 2019-09-17 NOTE — Progress Notes (Signed)
Subjective: Deborah Cline is a G0P0000 at [employer] who presents to the [facility] today for gyn visit. She does not have a history of any mental health concerns, however self reports ADHD diagnosis. She is currently sexually active. She is currently using nuvaring for birth control. Patient states mother as her support system.   BP 112/68 (BP Location: Right Arm, Patient Position: Sitting, Cuff Size: Normal)   Pulse 90   Temp 98 F (36.7 C) (Oral)   Ht 5\' 11"  (1.803 m)   Wt 169 lb 9.6 oz (76.9 kg)   LMP 09/09/2019 (Approximate)   BMI 23.65 kg/m   Birth Control History:  Nuvaring  MDM Patient counseled on all options for birth control today including LARC. Patient desires nuvaring initiated for birth control.   Assessment:  18 y.o. female considering nuvaring for birth control  Plan: No further plan   Lynnea Ferrier, LCSW 09/17/2019 11:02 AM

## 2019-09-18 LAB — CERVICOVAGINAL ANCILLARY ONLY
Chlamydia: NEGATIVE
Comment: NEGATIVE
Comment: NEGATIVE
Comment: NORMAL
Neisseria Gonorrhea: NEGATIVE
Trichomonas: NEGATIVE

## 2019-09-24 ENCOUNTER — Ambulatory Visit (HOSPITAL_COMMUNITY): Admission: RE | Admit: 2019-09-24 | Payer: Medicaid Other | Source: Ambulatory Visit

## 2019-10-27 ENCOUNTER — Ambulatory Visit (HOSPITAL_COMMUNITY)
Admission: RE | Admit: 2019-10-27 | Discharge: 2019-10-27 | Disposition: A | Discharge status: Home / Self Care | Payer: Medicaid Other | Source: Ambulatory Visit | Attending: Certified Nurse Midwife | Admitting: Certified Nurse Midwife

## 2019-10-27 ENCOUNTER — Other Ambulatory Visit: Payer: Self-pay

## 2019-10-27 DIAGNOSIS — R102 Pelvic and perineal pain: Secondary | ICD-10-CM | POA: Diagnosis not present

## 2019-10-27 DIAGNOSIS — R1032 Left lower quadrant pain: Secondary | ICD-10-CM | POA: Diagnosis not present

## 2019-11-01 ENCOUNTER — Telehealth: Payer: Self-pay | Admitting: Family

## 2019-11-01 DIAGNOSIS — F332 Major depressive disorder, recurrent severe without psychotic features: Secondary | ICD-10-CM

## 2019-11-01 DIAGNOSIS — F5104 Psychophysiologic insomnia: Secondary | ICD-10-CM

## 2019-11-01 DIAGNOSIS — F902 Attention-deficit hyperactivity disorder, combined type: Secondary | ICD-10-CM

## 2019-11-02 NOTE — Telephone Encounter (Signed)
Prozac 20 mg daily, # 30 with no RF's and Trazadone 100 mg 2 at HS, # 60 with no RF's.RX for above e-scribed and sent to pharmacy on record  [id number] [place], [place] - [address] [place] [address] Phone: [phone] Fax: [phone]   Needs F/u appt.

## 2019-11-10 DIAGNOSIS — Z1159 Encounter for screening for other viral diseases: Secondary | ICD-10-CM | POA: Diagnosis not present

## 2020-02-16 ENCOUNTER — Ambulatory Visit (INDEPENDENT_AMBULATORY_CARE_PROVIDER_SITE_OTHER): Payer: Medicaid Other | Admitting: Family

## 2020-02-16 ENCOUNTER — Encounter: Payer: Self-pay | Admitting: Family

## 2020-02-16 ENCOUNTER — Other Ambulatory Visit: Payer: Self-pay

## 2020-02-16 VITALS — BP 108/62 | HR 76 | Resp 16 | Ht 70.67 in | Wt 165.4 lb

## 2020-02-16 DIAGNOSIS — G4700 Insomnia, unspecified: Secondary | ICD-10-CM | POA: Diagnosis not present

## 2020-02-16 DIAGNOSIS — F332 Major depressive disorder, recurrent severe without psychotic features: Secondary | ICD-10-CM | POA: Diagnosis not present

## 2020-02-16 DIAGNOSIS — F902 Attention-deficit hyperactivity disorder, combined type: Secondary | ICD-10-CM | POA: Diagnosis not present

## 2020-02-16 DIAGNOSIS — Z719 Counseling, unspecified: Secondary | ICD-10-CM

## 2020-02-16 DIAGNOSIS — F5104 Psychophysiologic insomnia: Secondary | ICD-10-CM | POA: Diagnosis not present

## 2020-02-16 DIAGNOSIS — Z79899 Other long term (current) drug therapy: Secondary | ICD-10-CM

## 2020-02-16 DIAGNOSIS — G43009 Migraine without aura, not intractable, without status migrainosus: Secondary | ICD-10-CM

## 2020-02-16 MED ORDER — ALBUTEROL SULFATE (2.5 MG/3ML) 0.083% IN NEBU: 2.5000 mg | INHALATION_SOLUTION | Freq: Four times a day (QID) | RESPIRATORY_TRACT | 1 refills | Status: AC | PRN

## 2020-02-16 MED ORDER — FLUOXETINE HCL 20 MG PO CAPS: ORAL_CAPSULE | ORAL | 2 refills | Status: DC

## 2020-02-16 MED ORDER — TRAZODONE HCL 100 MG PO TABS: 200.0000 mg | ORAL_TABLET | Freq: Every day | ORAL | 2 refills | Status: DC

## 2020-02-16 NOTE — Patient Instructions (Signed)
Melatonin extended release to take 1 hour before bedtime.

## 2020-02-16 NOTE — Progress Notes (Signed)
[facility] [address] [place] [address] Dept: [phone] Dept Fax: [phone] Loc: [phone] Loc Fax: [phone]  Medication Check  Patient ID: Deborah Cline, female  DOB: Dec 13, 2000, 19 y.o.  MRN: [id number]  Date of Evaluation: 02/16/2020  PCP: Eliberto Ivory, MD  Accompanied by: Mother Patient Lives with: mother and visits dad when able to visit.   HISTORY/CURRENT STATUS: HPI Patient here by herself for the visit today. Patient just finished spring semester with 1 class at [employer]. May transition back to [employer].Planning on working full-time this summer to pay for school. Patient has not been consistent with her medications. Was off of her Prozac for several weeks and just restarted in mid-late march and not taking Evekeo unless she is in school. Had not been taking her Trazodone at bedtime either with no restarting to take it with inconsistency in her sleep pattern. No side effects reported, but did report mood was up and down.   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 Concerns: Initiation/Maintenance/Other: Trazadone for sleeping-2 daily at HS.   Individual Medical History/ Review of Systems: Changes? :Yes, U/S for ovarian pain but could not identify any issues with u/s.   Allergies: Cantaloupe (diagnostic), Fish allergy, and Peanut butter flavor  Current Medications:  Current Outpatient Medications:  .  albuterol (PROVENTIL) (2.5 MG/3ML)  0.083% nebulizer solution, Take 3 mLs (2.5 mg total) by nebulization every 6 (six) hours as needed for wheezing or shortness of breath., Disp: 75 mL, Rfl: 1 .  FLUoxetine (PROZAC) 20 MG capsule, TAKE 1-2 CAPSULES (20-40 MG TOTAL) BY MOUTH DAILY., Disp: 60 capsule, Rfl: 2 .  NUVARING 0.12-0.015 MG/24HR vaginal ring, USE ONE ring every THREE WEEKS, AND THEN REMOVE FOR ONE WEEK, Disp: 3 each, Rfl: 3 .  traZODone (DESYREL) 100 MG tablet, Take 2 tablets (200 mg total) by mouth at bedtime., Disp: 60 tablet, Rfl: 2 .  Amphetamine Sulfate (EVEKEO) 10 MG TABS, Take 20 mg by mouth 2 (two) times daily. (Patient not taking: Reported on 02/16/2020), Disp: 120 tablet, Rfl: 0 .  fexofenadine-pseudoephedrine (ALLEGRA-D) 60-120 MG 12 hr tablet, Take 1 tablet by mouth 2 (two) times daily. (Patient not taking: Reported on 02/16/2020), Disp: 30 tablet, Rfl: 0 Medication Side Effects: None  Family Medical/ Social History: Changes? None reported  MENTAL HEALTH: Mental Health Issues: Depression and Anxiety-Prozac to help control symptoms.   PHYSICAL EXAM; Vitals:  Vitals:   02/16/20 1055  BP: 108/62  Pulse: 76  Resp: 16  Weight: 165 lb 6.4 oz (75 kg)  Height: 5' 10.67" (1.795 m)   General Physical Exam: Unchanged from previous exam, date: last f/u Changed:none today  Testing/Developmental Screens:  none  DIAGNOSES:    ICD-10-CM   1. ADHD (attention deficit hyperactivity disorder), combined type  F90.2 FLUoxetine (PROZAC) 20 MG capsule  2. Severe episode of recurrent major depressive disorder, without psychotic features (HCC)  F33.2 FLUoxetine (PROZAC) 20 MG capsule  3. Insomnia, unspecified type  G47.00   4. Migraine without aura and without status migrainosus, not intractable  G43.009   5. Psychophysiological insomnia  F51.04 FLUoxetine (PROZAC) 20 MG capsule  6. Medication management  Z79.899   7. Patient counseled  Z71.9     RECOMMENDATIONS:  Counseling at this visit included the review of old  records and/or current chart with the patient with health, school, home environment and medication adherence.   Discussed recent history and today's examination with patient with no changes.   Counseled regarding health and changes. Will continue to monitor.   Recommended a high protein, low sugar diet, watch portion sizes, avoid second helpings, avoid sugary snacks and drinks, drink more water, eat more fruits and vegetables, increase daily exercise.  Discussed school academic and behavioral progress and advocated for appropriate accommodations as needed for support.   Discussed importance of maintaining structure, routine, organization, reward, motivation and consequences with consistency with school, home and work.   Counseled medication pharmacokinetics, options, dosage, administration, desired effects, and possible side effects.   Evekeo not taking at this time Prozac 20 mg 2 daily, # 60 with 2 RF's Trazodone 100 mg 2 at HS, # 60 with 2 RF's Albuterol inhaler, 2.5 mg every 6 hours prn, with 1 RF's RX for above e-scribed and sent to pharmacy on record  [id number] [place], [place] - [address] [place] [address] Phone: [phone] Fax: [phone]  Advised importance of:  Good sleep hygiene (8- 10 hours per night, no TV or video games for 1 hour before bedtime) Limited screen time (none on school nights, no more than 2 hours/day on weekends, use of screen time for motivation) Regular exercise(outside and active play) Healthy eating (drink water or milk, no sodas/sweet tea, limit portions and no seconds).   NEXT APPOINTMENT: Return in about 3 months (around 05/18/2020) for f/u visit. Will have f/u for medication management with adherence to medication in the next 2-3 weeks.  Medical Decision-making: More than 50% of the appointment was spent counseling and discussing diagnosis and management of symptoms with the patient and family.  Carolann Littler, NP Counseling Time: 30 mins Total Contact Time: 40 mins

## 2020-03-08 ENCOUNTER — Telehealth: Payer: Medicaid Other | Admitting: Family

## 2020-04-07 DIAGNOSIS — Z419 Encounter for procedure for purposes other than remedying health state, unspecified: Secondary | ICD-10-CM | POA: Diagnosis not present

## 2020-05-08 DIAGNOSIS — Z419 Encounter for procedure for purposes other than remedying health state, unspecified: Secondary | ICD-10-CM | POA: Diagnosis not present

## 2020-05-28 ENCOUNTER — Other Ambulatory Visit: Payer: Self-pay | Admitting: Family

## 2020-05-28 DIAGNOSIS — F5104 Psychophysiologic insomnia: Secondary | ICD-10-CM

## 2020-05-28 DIAGNOSIS — F332 Major depressive disorder, recurrent severe without psychotic features: Secondary | ICD-10-CM

## 2020-05-28 DIAGNOSIS — F902 Attention-deficit hyperactivity disorder, combined type: Secondary | ICD-10-CM

## 2020-05-30 NOTE — Telephone Encounter (Signed)
Trazodone 100 mg 2 tablets at HS, # 180 with 1 RF's and Prozac 20 mg 2 daily # 180 with 1 RF's.RX for above e-scribed and sent to pharmacy on record  [id number] [place], [place] - [address] [place] [address] Phone: [phone] Fax: [phone]

## 2020-05-31 ENCOUNTER — Other Ambulatory Visit: Payer: Self-pay

## 2020-05-31 ENCOUNTER — Ambulatory Visit (INDEPENDENT_AMBULATORY_CARE_PROVIDER_SITE_OTHER): Payer: Medicaid Other | Admitting: Family

## 2020-05-31 ENCOUNTER — Encounter: Payer: Self-pay | Admitting: Family

## 2020-05-31 VITALS — BP 102/68 | HR 72 | Resp 16 | Ht 70.0 in | Wt 173.2 lb

## 2020-05-31 DIAGNOSIS — Z719 Counseling, unspecified: Secondary | ICD-10-CM | POA: Diagnosis not present

## 2020-05-31 DIAGNOSIS — F332 Major depressive disorder, recurrent severe without psychotic features: Secondary | ICD-10-CM | POA: Diagnosis not present

## 2020-05-31 DIAGNOSIS — G4700 Insomnia, unspecified: Secondary | ICD-10-CM

## 2020-05-31 DIAGNOSIS — Z79899 Other long term (current) drug therapy: Secondary | ICD-10-CM | POA: Diagnosis not present

## 2020-05-31 DIAGNOSIS — F902 Attention-deficit hyperactivity disorder, combined type: Secondary | ICD-10-CM

## 2020-05-31 DIAGNOSIS — R278 Other lack of coordination: Secondary | ICD-10-CM

## 2020-05-31 DIAGNOSIS — F938 Other childhood emotional disorders: Secondary | ICD-10-CM

## 2020-05-31 DIAGNOSIS — G43009 Migraine without aura, not intractable, without status migrainosus: Secondary | ICD-10-CM | POA: Diagnosis not present

## 2020-05-31 DIAGNOSIS — Z7189 Other specified counseling: Secondary | ICD-10-CM

## 2020-05-31 MED ORDER — AMPHETAMINE SULFATE 10 MG PO TABS: 20.0000 mg | ORAL_TABLET | Freq: Two times a day (BID) | ORAL | 0 refills | Status: DC

## 2020-05-31 NOTE — Progress Notes (Signed)
[facility] [address] [place] [address] Dept: [phone] Dept Fax: [phone] Loc: [phone] Loc Fax: [phone]  Medication Check  Patient ID: Deborah Cline, female  DOB: 2001/08/28, 19 y.o.  MRN: [id number]  Date of Evaluation: 05/31/2020 PCP: Eliberto Ivory, MD  Accompanied by: self Patient Lives with: mother  HISTORY/CURRENT STATUS: HPI Patient here by herself for the appointment. Patient interactive and appropriate with provider today. Patient doing well with school and work at this point in time. Patient to complete 3 weeks course online and start in person classes at [employer]. Doing well with medications, but mother causing some issues related to anxiety.   EDUCATION: School: [employer], online class for 3 weeks, then in person classes.  Year/Grade: college  Services: Other: disability services as needed Activities/ Exercise: intermittently Working: [employer]  MEDICAL HISTORY: Appetite: Good  MVI/Other: None reported No changes in eating habits  Sleep: Bedtime: 1:00 am  Awakens: 8:00-10:00 am  Concerns: Initiation/Maintenance/Other: None reported  Individual Medical History/ Review of Systems: Changes? :None reported.   Allergies: Cantaloupe (diagnostic), Fish allergy, and Peanut butter flavor  Current Medications:  Current Outpatient Medications:    albuterol (PROVENTIL) (2.5 MG/3ML) 0.083% nebulizer solution, Take 3 mLs (2.5 mg total) by nebulization every 6 (six) hours as needed for wheezing or shortness of breath., Disp: 75 mL, Rfl: 1   Amphetamine Sulfate (EVEKEO) 10 MG TABS, Take 20 mg by mouth 2 (two) times daily., Disp: 120 tablet, Rfl: 0   doxycycline (VIBRAMYCIN) 100 MG capsule, Take 100 mg by mouth 2 (two) times daily., Disp: , Rfl:    FLUoxetine (PROZAC) 20 MG capsule, TAKE 1-2 CAPSULES  (20-40 MG TOTAL) BY MOUTH DAILY., Disp: 180 capsule, Rfl: 1   NUVARING 0.12-0.015 MG/24HR vaginal ring, USE ONE ring every THREE WEEKS, AND THEN REMOVE FOR ONE WEEK, Disp: 3 each, Rfl: 3   traZODone (DESYREL) 100 MG tablet, TAKE 2 TABLETS (200 MG TOTAL) BY MOUTH AT BEDTIME., Disp: 180 tablet, Rfl: 1   fexofenadine-pseudoephedrine (ALLEGRA-D) 60-120 MG 12 hr tablet, Take 1 tablet by mouth 2 (two) times daily. (Patient not taking: Reported on 02/16/2020), Disp: 30 tablet, Rfl: 0 Medication Side Effects: None  Family Medical/ Social History: Changes? None reported  MENTAL HEALTH: Mental Health Issues: Depression and Anxiety-Prozac 20 mg 2 capsules daily with good symptom control.  PHYSICAL EXAM; Vitals:  Vitals:   05/31/20 1408  BP: 102/68  Pulse: 72  Resp: 16  Height: 5\' 10"  (1.778 m)  Weight: 173 lb 3.2 oz (78.6 kg)  BMI (Calculated): 24.85   General Physical Exam: Unchanged from previous exam, date:f/u visit Changed: None  DIAGNOSES:    ICD-10-CM   1. ADHD (attention deficit hyperactivity disorder), combined type  F90.2   2. Severe episode of recurrent major depressive disorder, without psychotic features (HCC)  F33.2   3. Insomnia, unspecified type  G47.00   4. Anxiety disorder of adolescence  F93.8   5. Migraine without aura and without status migrainosus, not intractable  G43.009   6. Dysgraphia  R27.8   7. Medication management  Z79.899   8. Patient counseled  Z71.9   9. Goals of care, counseling/discussion  Z71.89    RECOMMENDATIONS:  Discussed recent history with patient with updates for school, academics, classes, schedule, health, work and medications.   Discussed school academic progress and recommended continued accommodations as needed for learning support with disability services.  Discussed growth and development and current weight. Recommended healthy food choices, watching portion sizes, avoiding second helpings, avoiding sugary drinks like soda and tea,  drinking more water, getting more exercise.   Discussed continued need for structure, routine, reward (external), motivation (internal), positive reinforcement, consequences, and organization with school and work schedules.   Encouraged recommended limitations on TV, tablets, phones, video games and computers for non-educational activities.   Discussed need for bedtime routine, use of good sleep hygiene, no video games, TV or phones for an hour before bedtime.   Encouraged physical activity and outdoor exercise, maintaining social distancing.   Counseled medication pharmacokinetics, options, dosage, administration, desired effects, and possible side effects.   Prozac 20 mg 2 daily, no Rx Trazadone 100 mg 2 at HS, no Rx Evekeo 10 mg 2 tablets BID, prn, # 120 with no RF's. RX for above e-scribed and sent to pharmacy on record  [id number] [place], [place] - [address] [place] [address] Phone: [phone] Fax: [phone]  NEXT APPOINTMENT:  Return in about 3 months (around 08/31/2020) for f/u visit.  The patient was advised to call back or seek an in-person evaluation if the symptoms worsen or if the condition fails to improve as anticipated.  Medical Decision-making: More than 50% of the appointment was spent counseling and discussing diagnosis and management of symptoms with the patient and family.  Carron Curie, NP

## 2020-06-06 DIAGNOSIS — M79642 Pain in left hand: Secondary | ICD-10-CM | POA: Diagnosis not present

## 2020-06-06 DIAGNOSIS — L03012 Cellulitis of left finger: Secondary | ICD-10-CM | POA: Diagnosis not present

## 2020-06-06 DIAGNOSIS — Z4789 Encounter for other orthopedic aftercare: Secondary | ICD-10-CM | POA: Diagnosis not present

## 2020-06-07 DIAGNOSIS — M79642 Pain in left hand: Secondary | ICD-10-CM | POA: Diagnosis not present

## 2020-06-08 ENCOUNTER — Ambulatory Visit: Payer: Self-pay | Admitting: Obstetrics and Gynecology

## 2020-06-08 DIAGNOSIS — M79642 Pain in left hand: Secondary | ICD-10-CM | POA: Diagnosis not present

## 2020-06-08 DIAGNOSIS — Z419 Encounter for procedure for purposes other than remedying health state, unspecified: Secondary | ICD-10-CM | POA: Diagnosis not present

## 2020-06-09 DIAGNOSIS — M79642 Pain in left hand: Secondary | ICD-10-CM | POA: Diagnosis not present

## 2020-06-10 DIAGNOSIS — M79642 Pain in left hand: Secondary | ICD-10-CM | POA: Diagnosis not present

## 2020-06-14 DIAGNOSIS — M79642 Pain in left hand: Secondary | ICD-10-CM | POA: Diagnosis not present

## 2020-06-15 DIAGNOSIS — M79642 Pain in left hand: Secondary | ICD-10-CM | POA: Diagnosis not present

## 2020-07-08 DIAGNOSIS — Z419 Encounter for procedure for purposes other than remedying health state, unspecified: Secondary | ICD-10-CM | POA: Diagnosis not present

## 2020-08-08 DIAGNOSIS — Z419 Encounter for procedure for purposes other than remedying health state, unspecified: Secondary | ICD-10-CM | POA: Diagnosis not present

## 2020-08-25 ENCOUNTER — Telehealth (INDEPENDENT_AMBULATORY_CARE_PROVIDER_SITE_OTHER): Payer: Medicaid Other | Admitting: Family

## 2020-08-25 ENCOUNTER — Encounter: Payer: Self-pay | Admitting: Family

## 2020-08-25 DIAGNOSIS — F332 Major depressive disorder, recurrent severe without psychotic features: Secondary | ICD-10-CM

## 2020-08-25 DIAGNOSIS — Z79899 Other long term (current) drug therapy: Secondary | ICD-10-CM | POA: Diagnosis not present

## 2020-08-25 DIAGNOSIS — G4700 Insomnia, unspecified: Secondary | ICD-10-CM | POA: Diagnosis not present

## 2020-08-25 DIAGNOSIS — Z3049 Encounter for surveillance of other contraceptives: Secondary | ICD-10-CM

## 2020-08-25 DIAGNOSIS — F411 Generalized anxiety disorder: Secondary | ICD-10-CM | POA: Diagnosis not present

## 2020-08-25 DIAGNOSIS — Z719 Counseling, unspecified: Secondary | ICD-10-CM | POA: Diagnosis not present

## 2020-08-25 DIAGNOSIS — Z7189 Other specified counseling: Secondary | ICD-10-CM

## 2020-08-25 DIAGNOSIS — R278 Other lack of coordination: Secondary | ICD-10-CM | POA: Diagnosis not present

## 2020-08-25 DIAGNOSIS — F902 Attention-deficit hyperactivity disorder, combined type: Secondary | ICD-10-CM

## 2020-08-25 DIAGNOSIS — R480 Dyslexia and alexia: Secondary | ICD-10-CM

## 2020-08-25 MED ORDER — NUVARING 0.12-0.015 MG/24HR VA RING: VAGINAL_RING | VAGINAL | 3 refills | Status: DC

## 2020-08-25 MED ORDER — AMPHETAMINE SULFATE 10 MG PO TABS: 20.0000 mg | ORAL_TABLET | Freq: Two times a day (BID) | ORAL | 0 refills | Status: DC

## 2020-08-25 NOTE — Progress Notes (Signed)
[facility] [address], [place] [place] [address] Dept: [phone] Dept Fax: [phone]  Medication Check visit via Virtual Video due to COVID-19  Patient ID:  Deborah Cline  female DOB: [birth year]   19 y.o.   MRN: [id number]   DATE:08/25/20  PCP: Eliberto Ivory, MD  Virtual Visit via Video Note  I connected with  Deborah Cline on 08/25/20 at 11:00 AM EST by a video enabled telemedicine application and verified that I am speaking with the correct person using two identifiers. Patient/Parent Location: At school   I discussed the limitations, risks, security and privacy concerns of performing an evaluation and management service by telephone and the availability of in person appointments. I also discussed with the parents that there may be a patient responsible charge related to this service. The parents expressed understanding and agreed to proceed.  Provider: Carron Curie, NP  Location: work location  HISTORY/CURRENT STATUS: Deborah Cline is here for medication management of the psychoactive medications for ADHD and review of educational and behavioral concerns.   Deborah Cline currently taking medication regimen, which is working well. Takes medication as directed daily. Medication tends to last for the time needed. Deborah Cline is able to focus through school/homework/work.   Deborah Cline is eating well (eating breakfast, lunch and dinner). Eating on and off.   Sleeping well (getting more sleep), sleeping through the night. Trazodone for sleep each night.   EDUCATION: School: [employer]  Year/Grade: college  Performance/ Grades:  Services: Other: Trying to get Disability Services  Activities/ Exercise: intermittently  Screen time: (phone, tablet, TV, computer): computer for learning, TV, phone and movies.   MEDICAL HISTORY: Individual Medical History/ Review of Systems:  Changes? :Yes, more abnormal bleeding, not consistent.   Family Medical/ Social History: Changes? None reported Patient Lives with: mother and stepfather  Current Medications:  Current Outpatient Medications  Medication Instructions   albuterol (PROVENTIL) 2.5 mg, Nebulization, Every 6 hours PRN   Amphetamine Sulfate (EVEKEO) 20 mg, Oral, 2 times daily   doxycycline (VIBRAMYCIN) 100 mg, 2 times daily   fexofenadine-pseudoephedrine (ALLEGRA-D) 60-120 MG 12 hr tablet 1 tablet, Oral, 2 times daily   FLUoxetine (PROZAC) 20 MG capsule TAKE 1-2 CAPSULES (20-40 MG TOTAL) BY MOUTH DAILY.   NUVARING 0.12-0.015 MG/24HR vaginal ring USE ONE ring every THREE WEEKS, AND THEN REMOVE FOR ONE WEEK   traZODone (DESYREL) 200 mg, Oral, Daily at bedtime   Medication Side Effects: None  MENTAL HEALTH: Mental Health Issues:   Depression and Anxiety-Prozac with symptom control    DIAGNOSES:    ICD-10-CM   1. ADHD (attention deficit hyperactivity disorder), combined type  F90.2   2. Encounter for surveillance of nuvaring  Z30.49 NUVARING 0.12-0.015 MG/24HR vaginal ring  3. Generalized anxiety disorder  F41.1   4. Insomnia, unspecified type  G47.00   5. Severe episode of recurrent major depressive disorder, without psychotic features (HCC)  F33.2   6. Dyslexia  R48.0   7. Dysgraphia  R27.8   8. Medication management  Z79.899   9. Patient counseled  Z71.9   10. Goals of care, counseling/discussion  Z71.89    RECOMMENDATIONS:  Discussed recent history with patient with updates for school, work, academics, health and medication.  Discussed school academic progress and recommended continued accommodations as needed for learning.   Discussed growth and development and current weight. Recommended healthy food choices, watching portion sizes, avoiding second helpings, avoiding sugary drinks like  soda and tea, drinking more water, getting more exercise.   Discussed continued need for structure,  routine, reward (external), motivation (internal), positive reinforcement, consequences, and organization with school, home and work.   Encouraged recommended limitations on TV, tablets, phones, video games and computers for non-educational activities.   Discussed need for bedtime routine, use of good sleep hygiene, no video games, TV or phones for an hour before bedtime.   Encouraged physical activity and outdoor play, maintaining social distancing.   Counseled medication pharmacokinetics, options, dosage, administration, desired effects, and possible side effects.   Evekeo 10 mg 1-2 tablets daily, # 60 with no RF's Trazodone 100 mg 2 at HS, no Rx today Prozac 20 mg daily, 1-2 capsule daily, no Rx today Nuvaring daily, # 3 with 3 RF's RX for above e-scribed and sent to pharmacy on record  [id number] [place], [place] - [address] [place] [address] Phone: [phone] Fax: [phone]   I discussed the assessment and treatment plan with the patient. The patient was provided an opportunity to ask questions and all were answered. The patient agreed with the plan and demonstrated an understanding of the instructions.   I provided 25 minutes of non-face-to-face time during this encounter.   Completed record review for 10 minutes prior to the virtual video visit.   NEXT APPOINTMENT:  Return in about 3 months (around 11/25/2020) for f/u visit.  The patient was advised to call back or seek an in-person evaluation if the symptoms worsen or if the condition fails to improve as anticipated.  Medical Decision-making: More than 50% of the appointment was spent counseling and discussing diagnosis and management of symptoms with the patient and family.  Carron Curie, NP

## 2020-09-07 DIAGNOSIS — Z419 Encounter for procedure for purposes other than remedying health state, unspecified: Secondary | ICD-10-CM | POA: Diagnosis not present

## 2020-10-05 ENCOUNTER — Other Ambulatory Visit: Payer: Self-pay

## 2020-10-05 ENCOUNTER — Telehealth: Payer: Medicaid Other | Admitting: Physician Assistant

## 2020-10-05 DIAGNOSIS — Z20822 Contact with and (suspected) exposure to covid-19: Secondary | ICD-10-CM | POA: Diagnosis not present

## 2020-10-05 DIAGNOSIS — R110 Nausea: Secondary | ICD-10-CM | POA: Diagnosis not present

## 2020-10-05 LAB — POC COVID19 BINAXNOW: SARS Coronavirus 2 Ag: NEGATIVE

## 2020-10-05 MED ORDER — ONDANSETRON 4 MG PO TBDP: 4.0000 mg | ORAL_TABLET | Freq: Three times a day (TID) | ORAL | 0 refills | Status: DC | PRN

## 2020-10-05 NOTE — Progress Notes (Signed)
New Patient Office Visit  Subjective:  Patient ID: Deborah Cline, female    DOB: 2001-02-23  Age: 19 y.o. MRN: [id number]  CC:  Chief Complaint  Patient presents with  . Covid Exposure   Virtual Visit via Telephone Note  I connected with Deborah Cline on 10/05/20 at 10:20 AM EST by telephone and verified that I am speaking with the correct person using two identifiers.  Location: Patient: Car Provider: [facility]    I discussed the limitations, risks, security and privacy concerns of performing an evaluation and management service by telephone and the availability of in person appointments. I also discussed with the patient that there may be a patient responsible charge related to this service. The patient expressed understanding and agreed to proceed.   History of Present Illness:  Reports that she started having chills, headache, body aches, and nausea last night, states that her family member tested positive for Covid.  Reports that she has not tried anything for relief.  Reports Deborah Cline and Deborah Cline Covid vaccine     Observations/Objective: Medical history and current medications reviewed, no physical exam completed      Past Medical History:  Diagnosis Date  . ADD (attention deficit disorder)   . 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 Asthma   . Depression   . Dyslexia   . Environmental and seasonal allergies   . Headache   . Insomnia 07/07/2015  . Syncope and collapse     Past Surgical History:  Procedure Laterality Date  . ADENOIDECTOMY      Family History  Problem Relation Age of Onset  . Mental illness Maternal Aunt   . Depression Mother     Social History   Socioeconomic History  . Marital status: Single    Spouse name: Not on file  . Number of children: Not on file  . Years of education: Not on file  . Highest education level: Some college, no degree  Occupational History  . Occupation: Waiter  Tobacco  Use  . Smoking status: Never Smoker  . Smokeless tobacco: Never Used  Vaping Use  . Vaping Use: Some days  . Substances: Flavoring  Substance and Sexual Activity  . Alcohol use: No  . Drug use: No  . Sexual activity: Yes    Partners: Male    Birth control/protection: Other-see comments    Comment: Nuvaring  Other Topics Concern  . Not on file  Social History Narrative   Deborah Cline is a 11th grade student.   She attends [employer].   She lives with her mom. She has an older sister.   She enjoys sleeping, eating, and video games.   Social Determinants of Health   Financial Resource Strain: Not on file  Food Insecurity: Not on file  Transportation Needs: Not on file  Physical Activity: Not on file  Stress: Not on file  Social Connections: Not on file  Intimate Partner Violence: Not on file    ROS Review of Systems  Constitutional: Positive for chills and fatigue. Negative for fever.  HENT: Negative for congestion, sinus pressure and sore throat.   Eyes: Negative.   Respiratory: Negative for cough, shortness of breath and wheezing.   Cardiovascular: Negative for chest pain.  Gastrointestinal: Positive for nausea. Negative for vomiting.  Endocrine: Negative.   Genitourinary: Negative.   Musculoskeletal: Positive for myalgias.  Skin: Negative.   Allergic/Immunologic: Negative.   Neurological: Positive for headaches.  Hematological:  Negative.   Psychiatric/Behavioral: Negative.     Objective:   Today's Vitals: There were no vitals taken for this visit.   Assessment & Plan:   Problem List Items Addressed This Visit   None   Visit Diagnoses    Close exposure to COVID-19 virus    -  Primary   Relevant Orders   POC COVID-19 (Completed)   Nausea without vomiting       Relevant Medications   ondansetron (ZOFRAN ODT) 4 MG disintegrating tablet      Outpatient Encounter Medications as of 10/05/2020  Medication Sig  . ondansetron (ZOFRAN ODT) 4  MG disintegrating tablet Take 1 tablet (4 mg total) by mouth every 8 (eight) hours as needed for nausea or vomiting.  Marland Kitchen albuterol (PROVENTIL) (2.5 MG/3ML) 0.083% nebulizer solution Take 3 mLs (2.5 mg total) by nebulization every 6 (six) hours as needed for wheezing or shortness of breath.  . Amphetamine Sulfate (EVEKEO) 10 MG TABS Take 20 mg by mouth 2 (two) times daily.  Marland Kitchen doxycycline (VIBRAMYCIN) 100 MG capsule Take 100 mg by mouth 2 (two) times daily. (Patient not taking: Reported on 08/25/2020)  . fexofenadine-pseudoephedrine (ALLEGRA-D) 60-120 MG 12 hr tablet Take 1 tablet by mouth 2 (two) times daily. (Patient not taking: Reported on 02/16/2020)  . FLUoxetine (PROZAC) 20 MG capsule TAKE 1-2 CAPSULES (20-40 MG TOTAL) BY MOUTH DAILY.  Marland Kitchen NUVARING 0.12-0.015 MG/24HR vaginal ring USE ONE ring every THREE WEEKS, AND THEN REMOVE FOR ONE WEEK  . traZODone (DESYREL) 100 MG tablet TAKE 2 TABLETS (200 MG TOTAL) BY MOUTH AT BEDTIME.   No facility-administered encounter medications on file as of 10/05/2020.   Assessment and Plan: 1. Close exposure to COVID-19 virus Rapid testing negative, no PCR testing was available on mobile unit today.  Patient encouraged to retest in 5 days, and sent home with instructions for home care and quarantine. Instructed to seek further care if symptoms worsen.    - POC COVID-19  2. Nausea without vomiting  - ondansetron (ZOFRAN ODT) 4 MG disintegrating tablet; Take 1 tablet (4 mg total) by mouth every 8 (eight) hours as needed for nausea or vomiting.  Dispense: 20 tablet; Refill: 0   Follow Up Instructions:    I discussed the assessment and treatment plan with the patient. The patient was provided an opportunity to ask questions and all were answered. The patient agreed with the plan and demonstrated an understanding of the instructions.   The patient was advised to call back or seek an in-person evaluation if the symptoms worsen or if the condition fails to improve  as anticipated.  I provided 21 minutes of non-face-to-face time during this encounter.   Meira Wahba S Mayers, PA-C    Follow-up: Return if symptoms worsen or fail to improve.   Kasandra Knudsen Mayers, PA-C

## 2020-10-05 NOTE — Patient Instructions (Signed)
I encourage you to use Zofran to help you with your nausea, stay very well-hydrated and get plenty of rest.  Continue using Tylenol or ibuprofen to help with your body aches, chills, headaches. Use your albuterol inhaler to help you with any shortness of breath.  Your rapid Covid test was negative, I encourage you to be retested in 5 days, until then I encourage you to take self isolation precaution.  I hope that you feel better soon. Please let us know if there is any else we can do for you  Roney Jaffe, PA-C Physician Assistant [facility] [email or web address]   COVID-19: Quarantine vs. Isolation QUARANTINE keeps someone who was in close contact with someone who has COVID-19 away from others. If you had close contact with a person who has COVID-19  Stay home until 14 days after your last contact.  Check your temperature twice a day and watch for symptoms of COVID-19.  If possible, stay away from people who are at higher-risk for getting very sick from COVID-19. ISOLATION keeps someone who is sick or tested positive for COVID-19 without symptoms away from others, even in their own home. If you are sick and think or know you have COVID-19  Stay home until after ? At least 10 days since symptoms first appeared and ? At least 24 hours with no fever without fever-reducing medication and ? Symptoms have improved If you tested positive for COVID-19 but do not have symptoms  Stay home until after ? 10 days have passed since your positive test If you live with others, stay in a specific "sick room" or area and away from other people or animals, including pets. Use a separate bathroom, if available. [email or web address] 04/27/2019 This information is not intended to replace advice given to you by your health care provider. Make sure you discuss any questions you have with your health care provider. Document Revised: 09/10/2019  Document Reviewed: 09/10/2019 Elsevier Patient Education  2020 [employer].

## 2020-10-05 NOTE — Progress Notes (Signed)
Patient complains of HA, body aches, nausea with the urge to vomit. Patient has exposure to a positive COVID family member and symptoms began this morning for patient.

## 2020-10-08 DIAGNOSIS — Z419 Encounter for procedure for purposes other than remedying health state, unspecified: Secondary | ICD-10-CM | POA: Diagnosis not present

## 2020-11-08 DIAGNOSIS — Z419 Encounter for procedure for purposes other than remedying health state, unspecified: Secondary | ICD-10-CM | POA: Diagnosis not present

## 2020-11-25 ENCOUNTER — Other Ambulatory Visit: Payer: Self-pay

## 2020-11-25 ENCOUNTER — Encounter: Payer: Medicaid Other | Admitting: Family

## 2020-11-27 NOTE — Progress Notes (Signed)
This encounter was created in error - please disregard.

## 2020-12-06 DIAGNOSIS — Z419 Encounter for procedure for purposes other than remedying health state, unspecified: Secondary | ICD-10-CM | POA: Diagnosis not present

## 2021-01-05 ENCOUNTER — Other Ambulatory Visit: Payer: Self-pay

## 2021-01-05 ENCOUNTER — Encounter: Payer: Self-pay | Admitting: Family

## 2021-01-05 ENCOUNTER — Ambulatory Visit (INDEPENDENT_AMBULATORY_CARE_PROVIDER_SITE_OTHER): Payer: Medicaid Other | Admitting: Family

## 2021-01-05 ENCOUNTER — Telehealth: Payer: Self-pay

## 2021-01-05 VITALS — BP 112/62 | HR 76 | Resp 16 | Ht 70.5 in | Wt 164.0 lb

## 2021-01-05 DIAGNOSIS — J3089 Other allergic rhinitis: Secondary | ICD-10-CM

## 2021-01-05 DIAGNOSIS — R480 Dyslexia and alexia: Secondary | ICD-10-CM

## 2021-01-05 DIAGNOSIS — R278 Other lack of coordination: Secondary | ICD-10-CM

## 2021-01-05 DIAGNOSIS — F5104 Psychophysiologic insomnia: Secondary | ICD-10-CM | POA: Diagnosis not present

## 2021-01-05 DIAGNOSIS — F332 Major depressive disorder, recurrent severe without psychotic features: Secondary | ICD-10-CM

## 2021-01-05 DIAGNOSIS — Z719 Counseling, unspecified: Secondary | ICD-10-CM | POA: Diagnosis not present

## 2021-01-05 DIAGNOSIS — Z7189 Other specified counseling: Secondary | ICD-10-CM | POA: Diagnosis not present

## 2021-01-05 DIAGNOSIS — F902 Attention-deficit hyperactivity disorder, combined type: Secondary | ICD-10-CM | POA: Diagnosis not present

## 2021-01-05 DIAGNOSIS — F819 Developmental disorder of scholastic skills, unspecified: Secondary | ICD-10-CM | POA: Diagnosis not present

## 2021-01-05 DIAGNOSIS — Z91199 Patient's noncompliance with other medical treatment and regimen due to unspecified reason: Secondary | ICD-10-CM

## 2021-01-05 DIAGNOSIS — Z9119 Patient's noncompliance with other medical treatment and regimen: Secondary | ICD-10-CM

## 2021-01-05 DIAGNOSIS — F988 Other specified behavioral and emotional disorders with onset usually occurring in childhood and adolescence: Secondary | ICD-10-CM | POA: Insufficient documentation

## 2021-01-05 DIAGNOSIS — G4709 Other insomnia: Secondary | ICD-10-CM

## 2021-01-05 DIAGNOSIS — F1290 Cannabis use, unspecified, uncomplicated: Secondary | ICD-10-CM

## 2021-01-05 MED ORDER — FLUOXETINE HCL 20 MG PO CAPS: ORAL_CAPSULE | ORAL | 1 refills | Status: DC

## 2021-01-05 MED ORDER — TRAZODONE HCL 100 MG PO TABS: 200.0000 mg | ORAL_TABLET | Freq: Every day | ORAL | 1 refills | Status: DC

## 2021-01-05 MED ORDER — AMPHETAMINE SULFATE 10 MG PO TABS: 20.0000 mg | ORAL_TABLET | Freq: Two times a day (BID) | ORAL | 0 refills | Status: DC

## 2021-01-05 NOTE — Progress Notes (Signed)
[facility] [address], [address] [place] [address] Dept: [phone] Dept Fax: [phone] Loc: [phone] Loc Fax: [phone]  Medication Check  Patient ID: Deborah Deborah Cline, female  DOB: 06-09-2001, 20 y.o.  MRN: [id number]  Date of Evaluation: 01/05/2021 PCP: Eliberto Ivory, MD  Accompanied by: self Patient Lives with: mother  HISTORY/CURRENT STATUS: HPI Patient here for the visit today. Has continued at [employer] this year with not changing to [employer] as planned. Unknown if she has services at school in place and needing letter for extended time and separate setting, per mother's request. Deborah Deborah Cline is currently only taking her Evekeo on long days or exam days, not using her Prozac since vacations with her BF and not consistent with taking her trazodone. No side effects from medications, just forgets to take it and no alarms for reminders.    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 No concerns.   Individual Medical History/ Review of Systems: Changes? :None reported recently.   Allergies: Cantaloupe (diagnostic), Fish allergy, and Peanut butter flavor  Current Medications: Current Outpatient Medications  Medication Instructions  . albuterol (PROVENTIL) 2.5 mg, Nebulization, Every 6 hours PRN  . Amphetamine Sulfate (EVEKEO) 20 mg, Oral, 2 times daily  . doxycycline (VIBRAMYCIN) 100 mg, 2 times daily  . fexofenadine-pseudoephedrine  (ALLEGRA-D) 60-120 MG 12 hr tablet 1 tablet, Oral, 2 times daily  . FLUoxetine (PROZAC) 20 MG capsule Take 1 capsule 2 times daily  . NUVARING 0.12-0.015 MG/24HR vaginal ring USE ONE ring every THREE WEEKS, AND THEN REMOVE FOR ONE WEEK  . ondansetron (ZOFRAN ODT) 4 mg, Oral, Every 8 hours PRN  . traZODone (DESYREL) 200 mg, Oral, Daily at bedtime   Medication Side Effects: None  Family Medical/ Social History: Changes? No  MENTAL HEALTH: Mental Health Issues: Depression and Anxiety -Not currently taking her medication  PHYSICAL EXAM; Vitals:  Vitals:   01/05/21 1005  BP: 112/62  Pulse: 76  Resp: 16  Height: 5' 10.5" (1.791 m)  Weight: 164 lb 0.3 oz (74.4 kg)  BMI (Calculated): 23.19   General Physical Exam: Unchanged from previous exam, date:None Changed:None  DIAGNOSES:    ICD-10-CM   1. Attention deficit hyperactivity disorder (ADHD), combined type  F90.2   2. Dyslexia  R48.0   3. Severe episode of recurrent major depressive disorder, without psychotic features (HCC)  F33.2 FLUoxetine (PROZAC) 20 MG capsule  4. Other insomnia  G47.09   5. Dysgraphia  R27.8   6. Learning difficulty  F81.9   7. Environmental and seasonal allergies  J30.89   8. Psychophysiological insomnia  F51.04 FLUoxetine (PROZAC) 20 MG capsule  9. Non-compliance  Z91.19   10. Marijuana use  F12.90   11. Patient counseled  Z71.9   12. Goals of care, counseling/discussion  Z71.89    ASSESSMENT: Patient attending school and struggling slightly this semester with school due to lack of help from the professors. Deborah Deborah Cline is not aware of disability services in place at this time. She has applied for the services for the Deborah Cline semester, but never follow up with [employer] on campus to know  or renew them for this year. Requesting letter for continued extended time and separate setting with her ADHD and Anxiety. Not currently taking her medications as prescribed for her ADHD, Anxiety or insomnia. Only taking Evekeo on long  days or exam days. Hasn't taken any other medications since came back from vacation with her BF, which she planned. Admitted to using marijuana along with BF for symptom management and sleep issues at night. No other reported health concerns, sleep history changes or eating difference since last visit. Discussion for medication use, abuse and limitation for prescribing with use of recreational substances.   RECOMMENDATIONS:  Discussed recent changes with school, academic issues, accommodations, health and medications.  Patient attending [employer] with unknown [employer] services. Information provided to patient regarding 1st year services received at school and needing to f/u every year to continued with [employer] services at school.  Patient requesting letter for [facility] services with history of diagnosis and current treatment along with services requesting extended time and separate setting for testing. To be sent to patient via e-mail and copy mailed for her to hand deliver to school.   Sleep schedule and sleep hygiene discussed at length. Patient not taking her Trazodone on a regular basis and having continued issues with not being able to sleep-insomnia. Encouraged to take her medication each night for better efficacy.   Patient with complaints ongoing issues with short term memory, attention to details, procrastination, and organization. These all related to her ADHD symptoms and suggested better medication compliance daily with her Evekeo.   Educated patient regarding regular use of marijuana and current medication in conjunction. Patient only using occasionally, but discussed better medication adherence would not have her self medicating with recreational substances. Also discussed continuation of use and discontinuation of presrcibing medications due to potential danger and fatal outcomes of mixing substance with her medications.   Counseled medication pharmacokinetics, options, dosage, administration,  desired effects, and possible side effects.   Evekeo 10 mg 2 tablet BID, daily, # 120 with no RF's Trazodone 100 mg 2 daily at HS, # 180 with 1 RF's Prozac 20 mg 2 daily, # 180 with 1 Rf's RX for above e-scribed and sent to pharmacy on record  [id number], [place] - [address] [place] [address] Phone: [phone] Fax: [phone]   I discussed the assessment and treatment plan with the patient. The patient was provided an opportunity to ask questions and all were answered. The patient agreed with the plan and demonstrated an understanding of the instructions.  NEXT APPOINTMENT: Return in about 3 months (around 04/06/2021) for f/u visit.  Carron Curie, NP Counseling Time: 38 mins Total Contact Time: 43 mins

## 2021-01-06 ENCOUNTER — Telehealth: Payer: Self-pay | Admitting: Family

## 2021-01-06 DIAGNOSIS — Z419 Encounter for procedure for purposes other than remedying health state, unspecified: Secondary | ICD-10-CM | POA: Diagnosis not present

## 2021-01-06 NOTE — Telephone Encounter (Signed)
  Faxed letter to [employer], mailed copy to [place], and emailed copy to [place].

## 2021-01-18 ENCOUNTER — Telehealth: Payer: Self-pay

## 2021-01-18 NOTE — Telephone Encounter (Signed)
Outcome  Additional Information Required  Prior Authorization Not Required

## 2021-02-05 DIAGNOSIS — Z419 Encounter for procedure for purposes other than remedying health state, unspecified: Secondary | ICD-10-CM | POA: Diagnosis not present

## 2021-02-23 ENCOUNTER — Encounter: Payer: Medicaid Other | Admitting: Family

## 2021-03-06 ENCOUNTER — Telehealth: Payer: Self-pay | Admitting: Internal Medicine

## 2021-03-06 MED ORDER — ONDANSETRON 4 MG PO TBDP: 4.0000 mg | ORAL_TABLET | Freq: Three times a day (TID) | ORAL | 0 refills | Status: DC | PRN

## 2021-03-06 MED ORDER — AZITHROMYCIN 250 MG PO TABS: ORAL_TABLET | ORAL | 0 refills | Status: DC

## 2021-03-06 MED ORDER — DM-GUAIFENESIN ER 30-600 MG PO TB12: 2.0000 | ORAL_TABLET | Freq: Two times a day (BID) | ORAL | 0 refills | Status: AC

## 2021-03-06 NOTE — Telephone Encounter (Signed)
Mother and sister are pts. Mother reports [name] has cold/ bronchitis. They are getting her a nebulizer machine. Asks Rx for Mucinex, Zpak, Zofran sent [facility].

## 2021-03-07 ENCOUNTER — Telehealth: Payer: Self-pay | Admitting: Internal Medicine

## 2021-03-07 MED ORDER — IPRATROPIUM-ALBUTEROL 0.5-2.5 (3) MG/3ML IN SOLN: 3.0000 mL | Freq: Four times a day (QID) | RESPIRATORY_TRACT | 12 refills | Status: AC | PRN

## 2021-03-07 NOTE — Telephone Encounter (Signed)
ATC patient at [phone], no answer and VM was full.  ATC the patient's mother Deborah Cline, no answer, left VM to have her daughter call our office so we can schedule her an appointment to see Dr. Maple Hudson (Wednesday 03/08/21).  Will await return call.

## 2021-03-07 NOTE — Telephone Encounter (Signed)
Mother has asked for script neb solution which I have sent to [facility].  Also asks me to see [name] as a new patient for acute asthmatic bronchitis. OK  Order- staff- I have time tomorrow- Wednesday June 1. Please see if we can get Deborah Cline in with me tomorrow morning as a new patient.

## 2021-03-08 ENCOUNTER — Ambulatory Visit (INDEPENDENT_AMBULATORY_CARE_PROVIDER_SITE_OTHER): Payer: Medicaid Other

## 2021-03-08 ENCOUNTER — Encounter: Payer: Self-pay | Admitting: Internal Medicine

## 2021-03-08 ENCOUNTER — Other Ambulatory Visit: Payer: Self-pay

## 2021-03-08 ENCOUNTER — Ambulatory Visit (INDEPENDENT_AMBULATORY_CARE_PROVIDER_SITE_OTHER): Payer: Medicaid Other | Admitting: Internal Medicine

## 2021-03-08 ENCOUNTER — Institutional Professional Consult (permissible substitution): Payer: Medicaid Other | Admitting: Internal Medicine

## 2021-03-08 DIAGNOSIS — J4541 Moderate persistent asthma with (acute) exacerbation: Secondary | ICD-10-CM

## 2021-03-08 DIAGNOSIS — J3089 Other allergic rhinitis: Secondary | ICD-10-CM

## 2021-03-08 DIAGNOSIS — J45901 Unspecified asthma with (acute) exacerbation: Secondary | ICD-10-CM | POA: Insufficient documentation

## 2021-03-08 DIAGNOSIS — J45909 Unspecified asthma, uncomplicated: Secondary | ICD-10-CM | POA: Diagnosis not present

## 2021-03-08 DIAGNOSIS — Z419 Encounter for procedure for purposes other than remedying health state, unspecified: Secondary | ICD-10-CM | POA: Diagnosis not present

## 2021-03-08 MED ORDER — ALBUTEROL SULFATE HFA 108 (90 BASE) MCG/ACT IN AERS: 2.0000 | INHALATION_SPRAY | Freq: Four times a day (QID) | RESPIRATORY_TRACT | 12 refills | Status: AC | PRN

## 2021-03-08 NOTE — Progress Notes (Deleted)
03/08/21- 20 yoF never smoker for evaluation of asthmatic bronchitis at request of her mother, my patient Gwinda Passe, NP. Medical problem list includes Migraine, Achilles tendonitis, Vasovagal Syncope, Depression, ADHD, Insomnia, Environmental and Seasonal Allergy, Dyslexia,  - Azithromycin, Prozac, Allegra-D, Mucinex-DM, Neb Duoneb, Evekeo amphetamine 10 mg, Zofran 4,

## 2021-03-08 NOTE — Progress Notes (Signed)
03/08/21- 20 yoF never smoker for evaluation of asthmatic bronchitis at request of her mother, my patient Deborah Passe, NP. Medical problem list includes Migraine, Achilles tendonitis, Vasovagal Syncope, Depression, ADHD, Insomnia, Environmental and Seasonal Allergy, Dyslexia,  - Azithromycin, Prozac, Allegra-Cline, Mucinex-DM, Neb Duoneb, Deborah Cline 10 mg, Zofran 4, Covid vax- J&J, 1 Phizer ACT score 11 Acute onset of viral pattern illness 4-5 days ago- sore throat, cervical nodes tender, bitemporal HA, wheeze and cough with clear mucus. Anorexia/ mild nausea. 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 Tends to use rescue inhaler once daily in winter weather, otherwise no asthma in a long time.  Nebulizer is helping. Ran out of albuterol hfa.  CXR 03/08/21-  FINDINGS: Lungs clear. Heart size normal. No pneumothorax or pleural fluid. No acute or focal bony abnormality. IMPRESSION: Negative chest.  Prior to Admission medications   Medication Sig Start Date End Date Taking? Authorizing Provider  albuterol (PROVENTIL) (2.5 MG/3ML) 0.083% nebulizer solution Take 3 mLs (2.5 mg total) by nebulization every 6 (six) hours as needed for wheezing or shortness of breath. 02/16/20  Yes Deborah Cline, Deborah Roux, NP  albuterol (VENTOLIN HFA) 108 (90 Base) MCG/ACT inhaler Inhale 2 puffs into the lungs every 6 (six) hours as needed for wheezing or shortness of breath. 03/08/21  Yes Deborah Cline, Deborah Fears D, MD  Cline Sulfate (Deborah) 10 MG TABS Take 20 mg by mouth 2 (two) times daily. 01/05/21  Yes Deborah Cline, Deborah Roux, NP  azithromycin (ZITHROMAX) 250 MG tablet 2 today then one daily 03/06/21  Yes Deborah Cline, Deborah Fears D, MD  dextromethorphan-guaiFENesin [facility] DM) 30-600 MG 12hr tablet Take 2 tablets by mouth 2 (two) times daily for 7 days. 03/06/21 03/13/21 Yes Deborah Cline D, MD  fexofenadine-pseudoephedrine (ALLEGRA-Cline) 60-120 MG 12 hr tablet Take 1 tablet by mouth 2 (two) times daily. 08/01/16  Yes Deborah Del R, NP   FLUoxetine (PROZAC) 20 MG capsule Take 1 capsule 2 times daily 01/05/21  Yes Deborah Cline, Deborah M, NP  ipratropium-albuterol (DUONEB) 0.5-2.5 (3) MG/3ML SOLN Take 3 mLs by nebulization every 6 (six) hours as needed. 03/07/21  Yes Deborah Cline, Deborah Fears D, MD  NUVARING 0.12-0.015 MG/24HR vaginal ring USE ONE ring every THREE WEEKS, AND THEN REMOVE FOR ONE WEEK 08/25/20  Yes Deborah Cline, Deborah Roux, NP  ondansetron (ZOFRAN ODT) 4 MG disintegrating tablet Take 1 tablet (4 mg total) by mouth every 8 (eight) hours as needed for nausea or vomiting. 10/05/20  Yes Mayers, Cari S, PA-C  ondansetron (ZOFRAN-ODT) 4 MG disintegrating tablet Take 1 tablet (4 mg total) by mouth every 8 (eight) hours as needed for nausea or vomiting. 03/06/21  Yes Deborah Duhamel D, MD  traZODone (DESYREL) 100 MG tablet Take 2 tablets (200 mg total) by mouth at bedtime. 01/05/21  Yes Deborah Cline, Deborah Roux, NP   Past Medical History:  Diagnosis Date  . ADD (attention deficit disorder)   . 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 Asthma   . Depression   . Dyslexia   . Environmental and seasonal allergies   . Headache   . Insomnia 07/07/2015  . Syncope and collapse    Past Surgical History:  Procedure Laterality Date  . ADENOIDECTOMY     Family History  Problem Relation Age of Onset  . Mental illness Maternal Aunt   . Depression Mother    Social History   Socioeconomic History  . Marital status: Single    Spouse name: Not on file  . Number of children: Not on file  . Years of education: Not  on file  . Highest education level: Some college, no degree  Occupational History  . Occupation: Waiter  Tobacco Use  . Smoking status: Never Smoker  . Smokeless tobacco: Never Used  Vaping Use  . Vaping Use: Some days  . Substances: Flavoring  Substance and Sexual Activity  . Alcohol use: No  . Drug use: No  . Sexual activity: Yes    Partners: Male    Birth control/protection: Other-see comments    Comment: Nuvaring   Other Topics Concern  . Not on file  Social History Narrative   Deborah Cline is a 11th grade student.   She attends [employer].   She lives with her mom. She has an older sister.   She enjoys sleeping, eating, and video games.   Social Determinants of Health   Financial Resource Strain: Not on file  Food Insecurity: Not on file  Transportation Needs: Not on file  Physical Activity: Not on file  Stress: Not on file  Social Connections: Not on file  Intimate Partner Violence: Not on file   ROS-see HPI   Negative unless "+" Constitutional:    weight loss, night sweats, fevers, chills, fatigue, lassitude. HEENT:    headaches, difficulty swallowing, tooth/dental problems, sore throat,       sneezing, itching, ear ache, nasal congestion, post nasal drip, snoring CV:    chest pain, orthopnea, PND, swelling in lower extremities, anasarca,                                  dizziness, palpitations Resp:   shortness of breath with exertion or at rest.                productive cough,   non-productive cough, coughing up of blood.              change in color of mucus.  wheezing.   Skin:    rash or lesions. GI:  No-   heartburn, indigestion, abdominal pain, nausea, vomiting, diarrhea,                 change in bowel habits, loss of appetite GU: dysuria, change in color of urine, no urgency or frequency.   flank pain. MS:   joint pain, stiffness, decreased range of motion, back pain. Neuro-     nothing unusual Psych:  change in mood or affect.  depression or anxiety.   memory loss.  OBJ- Physical Exam General- Alert, Oriented, Affect-appropriate, Distress- none acute Skin- rash-none, lesions- none, excoriation- none Lymphadenopathy+minimal anterior cervical adenopathy. Head- atraumatic            Eyes- Gross vision intact, PERRLA, conjunctivae and secretions clear            Ears- Hearing, canals-normal            Nose- Clear, no-Septal dev, mucus, polyps, erosion,  perforation             Throat- Mallampati II , mucosa clear , drainage- none, tonsils- atrophic Neck- flexible , trachea midline, no stridor , thyroid nl, carotid no bruit Chest - symmetrical excursion , unlabored           Heart/CV- RRR , no murmur , no gallop  , no rub, nl s1 s2                           -  JVD- none , edema- none, stasis changes- none, varices- none           Lung- clear to P&A, wheeze- none, cough+rattling , dullness-none, rub- none           Chest wall-  Abd-  Br/ Gen/ Rectal- Not done, not indicated Extrem- cyanosis- none, clubbing, none, atrophy- none, strength- nl Neuro- grossly intact to observation

## 2021-03-08 NOTE — Patient Instructions (Signed)
Script sent refilling albuterol rescue inhaler  Finish the Zpak antibiotic  Stay well-hydrated  Please cal if we can help

## 2021-03-08 NOTE — Assessment & Plan Note (Signed)
Some mild bitemporal HA but I don't think she has sinus infection at this time.

## 2021-03-08 NOTE — Assessment & Plan Note (Signed)
Viral syndrome exacerbation, non-specific. Tested neg for Covid. I think we can hold off on systemic steroids for now, respecting hx mood disturbance/ depression. Plan- Finish Zpak, refill rescue inhaler. Use neb up to every 6 hours as needed. Hydration and symptom meds.

## 2021-03-09 ENCOUNTER — Encounter: Payer: Self-pay | Admitting: *Deleted

## 2021-03-10 ENCOUNTER — Institutional Professional Consult (permissible substitution): Payer: Medicaid Other | Admitting: Internal Medicine

## 2021-03-15 NOTE — Telephone Encounter (Signed)
error 

## 2021-03-27 ENCOUNTER — Institutional Professional Consult (permissible substitution): Payer: Medicaid Other | Admitting: Internal Medicine

## 2021-04-07 DIAGNOSIS — Z419 Encounter for procedure for purposes other than remedying health state, unspecified: Secondary | ICD-10-CM | POA: Diagnosis not present

## 2021-05-03 ENCOUNTER — Other Ambulatory Visit: Payer: Self-pay

## 2021-05-03 ENCOUNTER — Encounter: Payer: Self-pay | Admitting: Family

## 2021-05-03 ENCOUNTER — Ambulatory Visit (INDEPENDENT_AMBULATORY_CARE_PROVIDER_SITE_OTHER): Payer: Medicaid Other | Admitting: Family

## 2021-05-03 VITALS — BP 108/66 | HR 68 | Resp 16 | Ht 70.25 in | Wt 155.6 lb

## 2021-05-03 DIAGNOSIS — F902 Attention-deficit hyperactivity disorder, combined type: Secondary | ICD-10-CM

## 2021-05-03 DIAGNOSIS — R480 Dyslexia and alexia: Secondary | ICD-10-CM | POA: Diagnosis not present

## 2021-05-03 DIAGNOSIS — F332 Major depressive disorder, recurrent severe without psychotic features: Secondary | ICD-10-CM | POA: Diagnosis not present

## 2021-05-03 DIAGNOSIS — Z79899 Other long term (current) drug therapy: Secondary | ICD-10-CM

## 2021-05-03 DIAGNOSIS — G4700 Insomnia, unspecified: Secondary | ICD-10-CM

## 2021-05-03 DIAGNOSIS — Z7189 Other specified counseling: Secondary | ICD-10-CM | POA: Diagnosis not present

## 2021-05-03 DIAGNOSIS — F411 Generalized anxiety disorder: Secondary | ICD-10-CM | POA: Diagnosis not present

## 2021-05-03 DIAGNOSIS — J3089 Other allergic rhinitis: Secondary | ICD-10-CM

## 2021-05-03 DIAGNOSIS — Z719 Counseling, unspecified: Secondary | ICD-10-CM | POA: Diagnosis not present

## 2021-05-03 DIAGNOSIS — F1290 Cannabis use, unspecified, uncomplicated: Secondary | ICD-10-CM

## 2021-05-03 MED ORDER — AMPHETAMINE SULFATE 10 MG PO TABS
20.0000 mg | ORAL_TABLET | Freq: Two times a day (BID) | ORAL | 0 refills | Status: DC
Start: 2021-05-03 — End: 2021-06-01

## 2021-05-03 NOTE — Progress Notes (Signed)
[facility] [address], [address] [place] [address] Dept: [phone] Dept Fax: [phone] Loc: [phone] Loc Fax: [phone]  Medication Check  Patient ID: Deborah Cline, female  DOB: 08-03-2001, 20 y.o.  MRN: [id number]  Date of Evaluation: 05/03/2021 PCP: Eliberto Ivory, MD  Accompanied by:  self Patient Lives with: mother  HISTORY/CURRENT STATUS: HPI Patient here by herself for the visit today. Patient interactive and appropriate with provider today. Patient attending school for nursing and above average grades. Disability Services in place and helping at school in a small setting. 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 No recent issues or concerns since the last visit. Evekeo for school or daily functioning, Prozac 20 mg 1-2 daily, and Trazadone 100 mg 2 daily at HS with no concerns or issues.   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   Individual Medical History/ Review of Systems: Changes? :None reported by patient.   Allergies: Cantaloupe (diagnostic), Fish allergy, and Peanut butter flavor  Current Medications: Current Outpatient Medications  Medication Instructions   albuterol (PROVENTIL) 2.5 mg, Nebulization, Every 6 hours PRN   albuterol (VENTOLIN HFA) 108 (90 Base) MCG/ACT inhaler 2 puffs, Inhalation, Every 6 hours PRN   Amphetamine Sulfate (EVEKEO) 20 mg, Oral, 2 times daily    fexofenadine-pseudoephedrine (ALLEGRA-D) 60-120 MG 12 hr tablet 1 tablet, Oral, 2 times daily   FLUoxetine (PROZAC) 20 MG capsule Take 1 capsule 2 times daily   ipratropium-albuterol (DUONEB) 0.5-2.5 (3) MG/3ML SOLN 3 mLs, Nebulization, Every 6 hours PRN   NUVARING 0.12-0.015 MG/24HR vaginal ring USE ONE ring every THREE WEEKS, AND THEN REMOVE FOR ONE WEEK   ondansetron (ZOFRAN ODT) 4 mg, Oral, Every 8 hours PRN   traZODone (DESYREL) 200 mg, Oral, Daily at bedtime  Medication Side Effects: None  Family Medical/ Social History: Changes? None  MENTAL HEALTH: Mental Health Issues: Depression and Anxiety-Prozac 20 mg 2 daily. Good symptom control with her medications.   PHYSICAL EXAM; Vitals: Vitals:   05/03/21 1434  BP: 108/66  Pulse: 68  Resp: 16  Weight: 155 lb 9.6 oz (70.6 kg)  Height: 5' 10.25" (1.784 m)   General Physical Exam: Unchanged from previous exam, date:01/05/2021 Changed:None  DIAGNOSES:    ICD-10-CM   1. ADHD (attention deficit hyperactivity disorder), combined type  F90.2     2. Dyslexia  R48.0     3. Severe episode of recurrent major depressive disorder, without psychotic features (HCC)  F33.2     4. Generalized anxiety disorder  F41.1     5. Environmental and seasonal allergies  J30.89     6. Insomnia, unspecified type  G47.00     7. Medication management  Z79.899     8. Patient counseled  Z71.9     9. Goals of care, counseling/discussion  Z71.89     10. Marijuana use  F12.90      ASSESSMENT: Deborah Cline is a 20 year old female with a history of ADHD, Anxiety, Depression and Learning issues. Efficacy for attention and anxiety/depression with current use of Evekeo daily and Prozac daily, if consistent with  taking medications. Attending [employer] and services in place for academics and attentions for learning success. Sleeping better with physical activity or working along with trazodone at HS. Eating with no changes and f/u with specialists as needed  for care. Has continued with use of marijuana use for symptom control along with BF. No changes needed for current medications or doses. To reassess medications at next f/u visit.   RECOMMENDATIONS:  Patient provided updates for school, learning support, progress, plans for fall semester and classes for the next semester.   Discussed current services at school for learning.  Has continued with disability services and tutoring as needed.  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 To decrease hours for the school semester.   Eating with no current concerns and not taking any vitamins to supplement dietary intake. Encouraged healthy variety of foods with water for hydration.   Sleep issues is not getting enough physical activity or work during the day. Encouraged regular exercise for natural endorphins. Has continued to use Trazadone at [facility] for assistance with sleep.   Discussed history of marijuana use and limiting with current medications. Also discussed history of persistent asthma and recent f/u with pulmonology.   Reviewed previous discussion with patient regarding regular use of marijuana and current medication in conjunction. Patient only using occasionally, but discussed better medication adherence would not have her self medicating with recreational substances. Also discussed continuation of use and discontinuation of presrcibing medications due to potential danger and fatal outcomes of mixing substance with her medications.   Counseled medication pharmacokinetics, options, dosage, administration, desired effects, and possible side effects.   Evekeo 10 mg 1-2 BID, # 120 with no RF"s.  Prozac 20 mg 1 BID no Rx today RX for above e-scribed and sent to pharmacy on record  [id number] [place] [facility], [place] - [address] [place] [address] Phone: [phone] Fax: [phone]  I discussed the assessment and  treatment plan with the patient. The patient was provided an opportunity to ask questions and all were answered. The patient agreed with the plan and demonstrated an understanding of the instructions.  NEXT APPOINTMENT: Return in about 3 months (around 08/03/2021) for f/u visit.  Carron Curie, NP Counseling Time: 33 mins Total Contact Time: 35 mins

## 2021-05-08 DIAGNOSIS — Z419 Encounter for procedure for purposes other than remedying health state, unspecified: Secondary | ICD-10-CM | POA: Diagnosis not present

## 2021-06-01 ENCOUNTER — Other Ambulatory Visit: Payer: Self-pay

## 2021-06-01 MED ORDER — AMPHETAMINE SULFATE 10 MG PO TABS: 20.0000 mg | ORAL_TABLET | Freq: Two times a day (BID) | ORAL | 0 refills | Status: DC

## 2021-06-01 NOTE — Telephone Encounter (Signed)
Evekeo 10 mg 2 daily, # 60 with no RF's.RX for above e-scribed and sent to pharmacy on record  [id number] [place], [place] - [address] [place] [address] Phone: [phone] Fax: [phone]

## 2021-06-08 DIAGNOSIS — Z419 Encounter for procedure for purposes other than remedying health state, unspecified: Secondary | ICD-10-CM | POA: Diagnosis not present

## 2021-06-21 ENCOUNTER — Other Ambulatory Visit: Payer: Self-pay | Admitting: Family

## 2021-06-21 DIAGNOSIS — Z3049 Encounter for surveillance of other contraceptives: Secondary | ICD-10-CM

## 2021-07-08 DIAGNOSIS — Z419 Encounter for procedure for purposes other than remedying health state, unspecified: Secondary | ICD-10-CM | POA: Diagnosis not present

## 2021-07-13 ENCOUNTER — Encounter: Payer: Self-pay | Admitting: Family

## 2021-07-13 ENCOUNTER — Ambulatory Visit (INDEPENDENT_AMBULATORY_CARE_PROVIDER_SITE_OTHER): Payer: Medicaid Other | Admitting: Family

## 2021-07-13 ENCOUNTER — Other Ambulatory Visit: Payer: Self-pay

## 2021-07-13 VITALS — BP 116/68 | HR 76 | Resp 16 | Ht 70.25 in | Wt 152.8 lb

## 2021-07-13 DIAGNOSIS — F902 Attention-deficit hyperactivity disorder, combined type: Secondary | ICD-10-CM | POA: Diagnosis not present

## 2021-07-13 DIAGNOSIS — F411 Generalized anxiety disorder: Secondary | ICD-10-CM

## 2021-07-13 DIAGNOSIS — R480 Dyslexia and alexia: Secondary | ICD-10-CM

## 2021-07-13 DIAGNOSIS — F332 Major depressive disorder, recurrent severe without psychotic features: Secondary | ICD-10-CM

## 2021-07-13 DIAGNOSIS — F5104 Psychophysiologic insomnia: Secondary | ICD-10-CM

## 2021-07-13 DIAGNOSIS — F819 Developmental disorder of scholastic skills, unspecified: Secondary | ICD-10-CM

## 2021-07-13 DIAGNOSIS — G4700 Insomnia, unspecified: Secondary | ICD-10-CM

## 2021-07-13 DIAGNOSIS — Z7189 Other specified counseling: Secondary | ICD-10-CM

## 2021-07-13 DIAGNOSIS — Z79899 Other long term (current) drug therapy: Secondary | ICD-10-CM

## 2021-07-13 DIAGNOSIS — Z719 Counseling, unspecified: Secondary | ICD-10-CM

## 2021-07-13 MED ORDER — BUSPIRONE HCL 10 MG PO TABS: 10.0000 mg | ORAL_TABLET | Freq: Every day | ORAL | 2 refills | Status: DC

## 2021-07-13 MED ORDER — TRIAMCINOLONE 0.1 % CREAM:EUCERIN CREAM 1:1: 1.0000 "application " | TOPICAL_CREAM | Freq: Two times a day (BID) | CUTANEOUS | 1 refills | Status: AC

## 2021-07-13 MED ORDER — FLUOXETINE HCL 20 MG PO CAPS: ORAL_CAPSULE | ORAL | 1 refills | Status: AC

## 2021-07-13 NOTE — Progress Notes (Signed)
[facility] [address], [address] [place] [address] Dept: [phone] Dept Fax: [phone] Loc: [phone] Loc Fax: [phone]  Medication Check  Patient ID: [name], female  DOB: July 29, 2001, 20 y.o.  MRN: [id number]  Date of Evaluation: 07/13/2021 PCP: Eliberto Ivory, MD  Accompanied by:  self Patient Lives with: mother  HISTORY/CURRENT STATUS: HPI Patient here by herself today and interactive and appropriate with provider. Attending college full time and struggling with Chemistry. Has ARC services in place now and professor now have to apply to each class.  Getting tutoring for chemistry with increased difficulty. Taking her ADHD and Anxiety medication on occasion, no side effects reported.   EDUCATION/WORK: School: [employer] Year/Grade:  college   Homework Hours Spent: several hours and Engineer, technical sales for [employer] Grades: average Services: Other: disability services Activities/ Exercise: rarely Work: [employer] Working 2-3 days/week 24 hours on average.   MEDICAL HISTORY: Appetite: Good  MVI/Other: None    Sleep: Bedtime: 12:00 am the latest Awakens: 10:00 am   Concerns: Initiation/Maintenance/Other: None recently reported.  Individual Medical History/ Review of Systems: Changes? :None reported in the past 3 months.   Allergies: Cantaloupe (diagnostic), Fish allergy, and Peanut butter flavor  Current Medications: Scheduled Meds: Continuous Infusions: PRN Meds:. Medication Side Effects: None  Family Medical/ Social History: Changes? None  MENTAL HEALTH: Mental Health Issues: Depression and Anxiety-Prozac with symptom control. Occasional panic and "meltdowns" with school work becoming overwhelming.   PHYSICAL EXAM; Vitals:  Vitals:   07/13/21 1413  BP: 116/68  Pulse: 76  Resp: 16  Weight: 152 lb 12.8 oz  (69.3 kg)  Height: 5' 10.25" (1.784 m)    General Physical Exam: Unchanged from previous exam, date: 05/03/2021 Changed:None  DIAGNOSES:    ICD-10-CM   1. ADHD (attention deficit hyperactivity disorder), combined type  F90.2     2. Dyslexia  R48.0     3. Severe episode of recurrent major depressive disorder, without psychotic features (HCC)  F33.2 FLUoxetine (PROZAC) 20 MG capsule    4. Learning difficulty  F81.9     5. Insomnia, unspecified type  G47.00     6. Generalized anxiety disorder  F41.1     7. Medication management  Z79.899     8. Psychophysiological insomnia  F51.04 FLUoxetine (PROZAC) 20 MG capsule    9. Patient counseled  Z71.9     10. Goals of care, counseling/discussion  Z71.89      ASSESSMENT:Nonie is a 20 year old female with a history of ADHD, Anxiety, Depression and Dyslexia. Patient has been intermittently controlled    RECOMMENDATIONS:  Patient provided updates with school, home, work, relationship, health and medication.   Patient has ARC services, but not having professors put in place due to lack of ARC department to make each professor aware of accommodations/modifications. Support provided to patient.  Patient continuing to work at [employer] on weekends, about 24 hours for total of 2-3 days of work. No reported issues with completion of work duties or tasks.   Not currently working out or performing any regular exercise routine. Is getting some walking on campus and at work on the weekends.   No changes with eating habits and suggested healthy variety of foods each day. Encouraged enough fluid intake daily.  Sleeping better and feels tired most days with school. Not taking her medication most night due to sleeping with no difficulties.   Discussed  history of marijuana use and limiting with current medications. History of asthma and history of flare ups.  Reviewed previous discussion again today with patient regarding regular use of marijuana and  current medication in conjunction. Patient only using occasionally, but discussed better medication adherence would not have her self medicating with recreational substances. Also discussed continuation of use and discontinuation of presrcibing medications due to potential danger and fatal outcomes of mixing substance with her medications.    Counseled medication pharmacokinetics, options, dosage, administration, desired effects, and possible side effects.   Evekeo 10 mg 1-2 BID, no Rx today Prozac 20 mg 1 BID, # 60 with 2 RF's Buspar 10 mg PRN for anxiety, # 30 with 2 RF's Triamcinolone Cream and Eucerin cream applied 2 times daily to affected areas, # 1 jar with 1 RF's (printed) RX for above e-scribed and sent to pharmacy on record  [id number] [place], [place] - [address] [place] [address] Phone: [phone] Fax: [phone]  I discussed the assessment and treatment plan with the patient. The patient was provided an opportunity to ask questions and all were answered. The patient agreed with the plan and demonstrated an understanding of the instructions.  NEXT APPOINTMENT: Return in about 3 months (around 10/13/2021) for f/u visit.  Carron Curie, NP Counseling Time: 43 minutes Total Contact Time: 49 minutes

## 2021-07-14 ENCOUNTER — Encounter: Payer: Self-pay | Admitting: Family

## 2021-08-06 ENCOUNTER — Other Ambulatory Visit: Payer: Self-pay | Admitting: Family

## 2021-08-07 NOTE — Telephone Encounter (Signed)
Buspar 10 mg daily, # 90 with 1 RF's.RX for above e-scribed and sent to pharmacy on record  [id number] [place], [place] - [address] [place] [address] Phone: [phone] Fax: [phone]

## 2021-08-08 DIAGNOSIS — Z419 Encounter for procedure for purposes other than remedying health state, unspecified: Secondary | ICD-10-CM | POA: Diagnosis not present

## 2021-09-03 IMAGING — US US PELVIS COMPLETE
1 series · 15 of 20 positions shown · non-contrast
Comparison: None.

CLINICAL DATA: 18-year-old female with left lower quadrant pain and
tenderness. Inpatient on contraceptive therapy with NuvaRing.

EXAM:
TRANSABDOMINAL ULTRASOUND OF PELVIS
TECHNIQUE: Transabdominal ultrasound examination of the pelvis was performed
including evaluation of the uterus, ovaries, adnexal regions, and
pelvic cul-de-sac.

[Series 1: us pelvis complete · 15 of 20 slices shown]
[im 1/20]
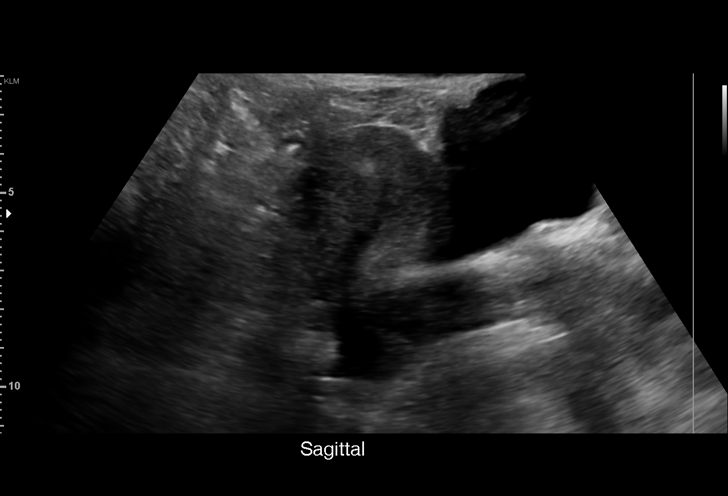
[im 3/20]
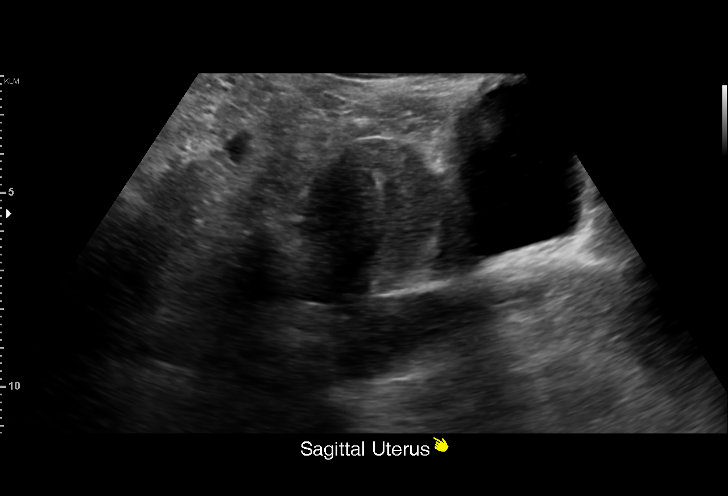
[im 4/20]
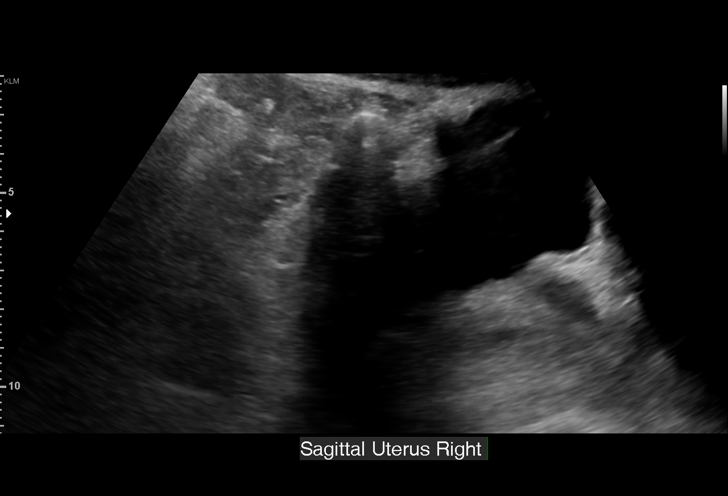
[im 5/20]
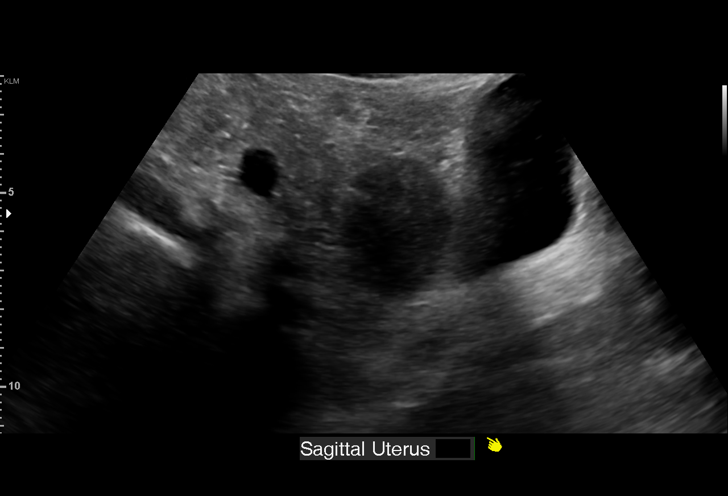
[im 7/20]
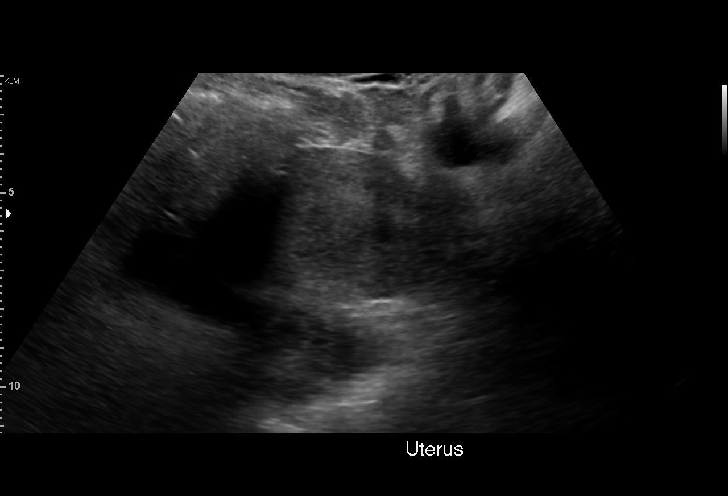
[im 8/20]
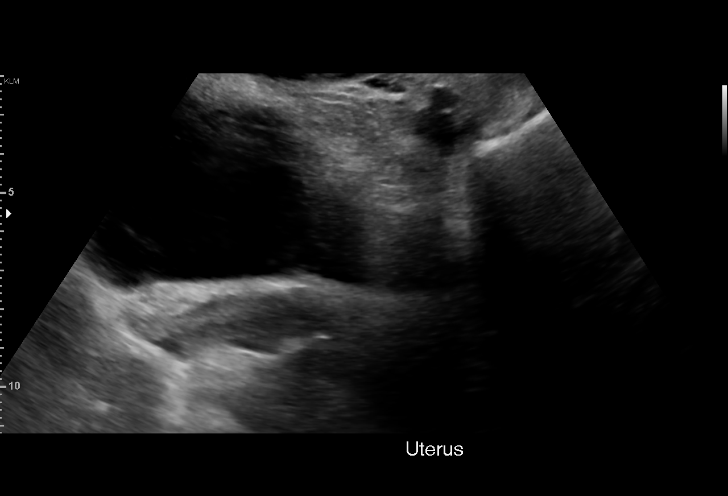
[im 9/20]
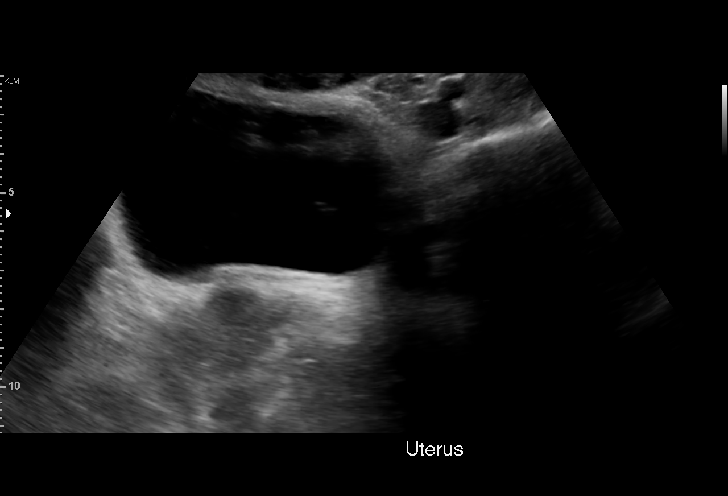
[im 11/20]
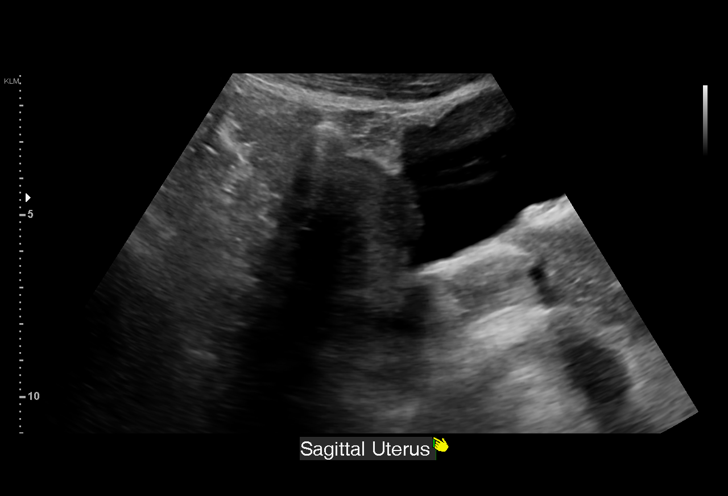
[im 12/20]
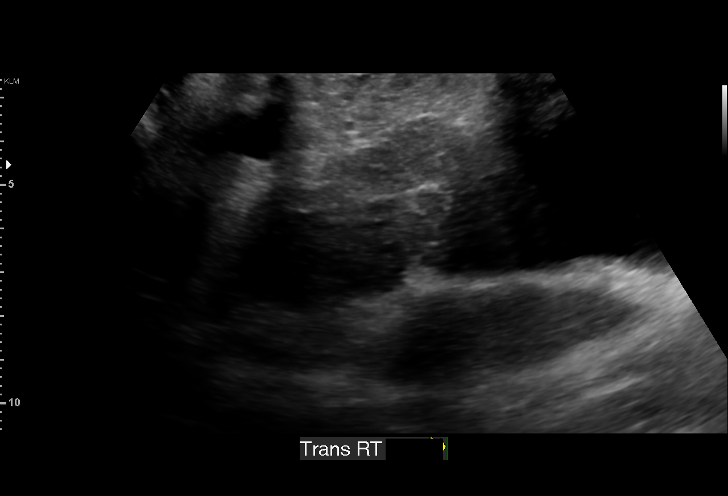
[im 13/20]
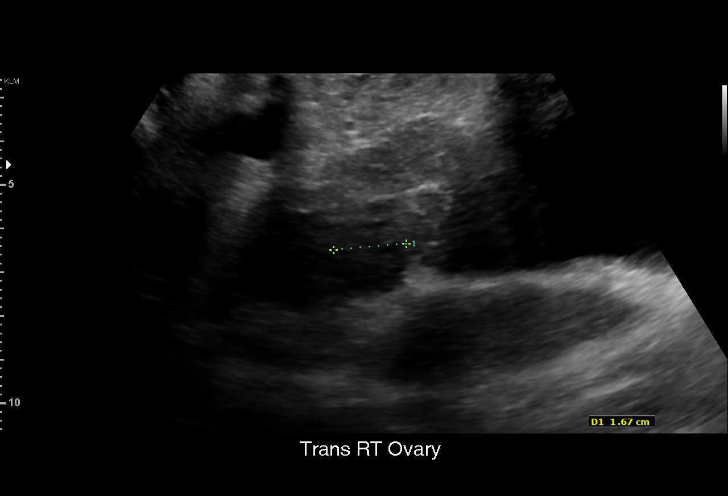
[im 15/20]
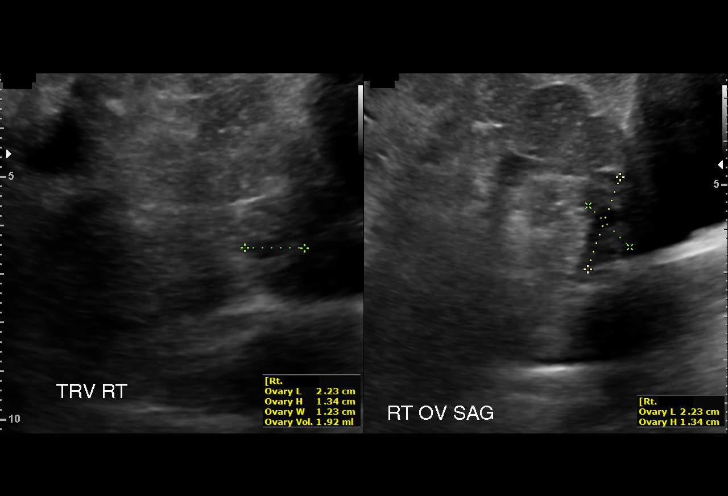
[im 16/20]
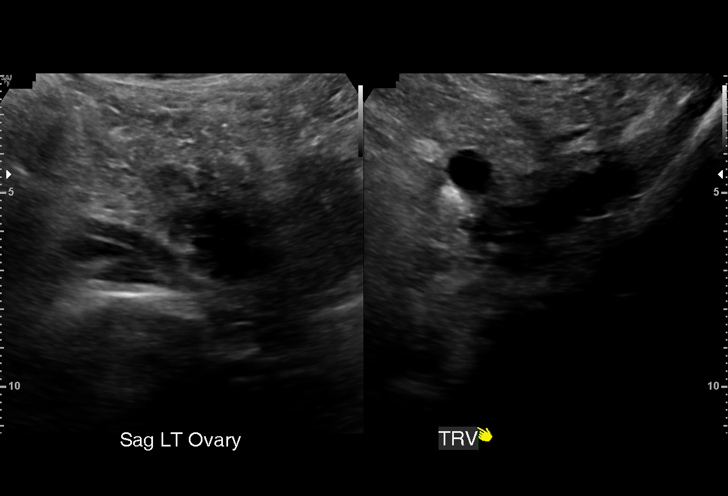
[im 17/20]
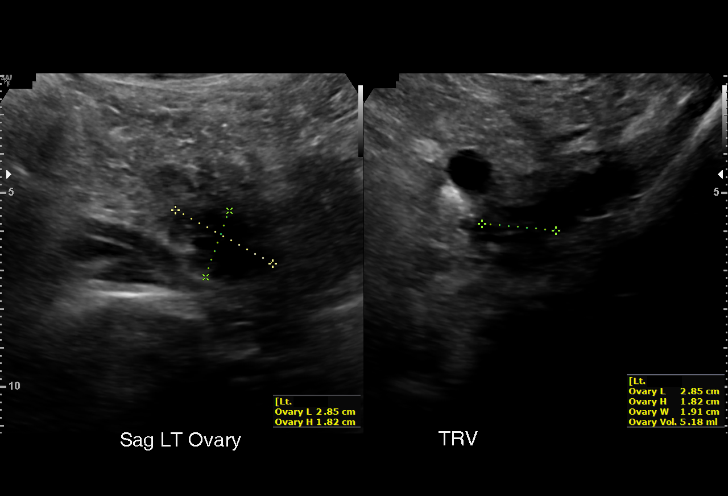
[im 19/20]
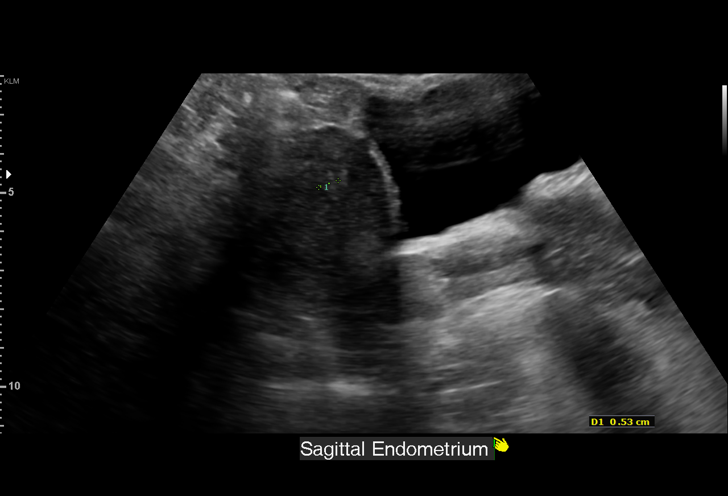
[im 20/20]
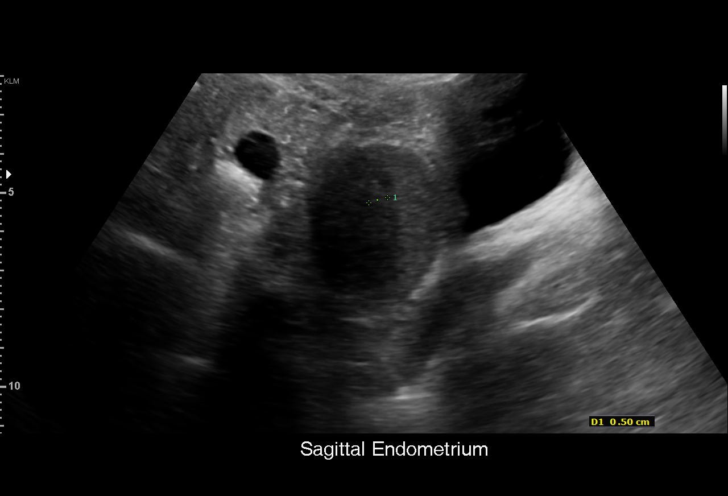

[15 of 20 positions shown; findings below may reference images not displayed]

FINDINGS: Uterus

Measurements: 6.5 x 3.8 x 4.4 cm = volume: 58 mL. Anteverted uterus
is normal in size and configuration, with no uterine fibroids or
other myometrial abnormalities.

Endometrium

Thickness: 5 mm. No endometrial cavity fluid or focal endometrial
mass demonstrated.

Right ovary

Measurements: 2.2 x 1.3 x 1.2 cm = volume: 1.9 mL. Normal
appearance/no adnexal mass.

Left ovary

Measurements: 2.9 x 1.8 x 1.9 cm = volume: 5.2 mL. Normal
appearance/no adnexal mass. Visualization limited by overlying bowel
gas.

Other findings:  No abnormal free fluid.
IMPRESSION: Unremarkable transabdominal pelvic sonogram. Examination somewhat
limited by bowel gas. If the patient's symptoms persist or worsen,
consider transvaginal pelvic ultrasound.

## 2021-09-07 DIAGNOSIS — Z419 Encounter for procedure for purposes other than remedying health state, unspecified: Secondary | ICD-10-CM | POA: Diagnosis not present

## 2021-10-08 DIAGNOSIS — Z419 Encounter for procedure for purposes other than remedying health state, unspecified: Secondary | ICD-10-CM | POA: Diagnosis not present

## 2021-10-11 ENCOUNTER — Encounter: Payer: Self-pay | Admitting: Family

## 2021-10-11 ENCOUNTER — Other Ambulatory Visit: Payer: Self-pay

## 2021-10-11 ENCOUNTER — Ambulatory Visit (INDEPENDENT_AMBULATORY_CARE_PROVIDER_SITE_OTHER): Payer: Medicaid Other | Admitting: Family

## 2021-10-11 VITALS — BP 118/64 | HR 76 | Resp 16 | Ht 70.47 in | Wt 150.8 lb

## 2021-10-11 DIAGNOSIS — R278 Other lack of coordination: Secondary | ICD-10-CM

## 2021-10-11 DIAGNOSIS — Z79899 Other long term (current) drug therapy: Secondary | ICD-10-CM

## 2021-10-11 DIAGNOSIS — F332 Major depressive disorder, recurrent severe without psychotic features: Secondary | ICD-10-CM | POA: Diagnosis not present

## 2021-10-11 DIAGNOSIS — Z7189 Other specified counseling: Secondary | ICD-10-CM

## 2021-10-11 DIAGNOSIS — F411 Generalized anxiety disorder: Secondary | ICD-10-CM

## 2021-10-11 DIAGNOSIS — R480 Dyslexia and alexia: Secondary | ICD-10-CM | POA: Diagnosis not present

## 2021-10-11 DIAGNOSIS — Z72821 Inadequate sleep hygiene: Secondary | ICD-10-CM

## 2021-10-11 DIAGNOSIS — Z719 Counseling, unspecified: Secondary | ICD-10-CM | POA: Diagnosis not present

## 2021-10-11 DIAGNOSIS — F902 Attention-deficit hyperactivity disorder, combined type: Secondary | ICD-10-CM

## 2021-10-11 MED ORDER — AMPHETAMINE SULFATE 10 MG PO TABS: 20.0000 mg | ORAL_TABLET | Freq: Two times a day (BID) | ORAL | 0 refills | Status: DC

## 2021-10-11 NOTE — Progress Notes (Signed)
[facility] [address] [place] [address] Dept: [phone] Dept Fax: [phone] Loc: [phone] Loc Fax: [phone]  Medication Check  Patient ID: Deborah Cline First, female  DOB: 2001-01-14, 21 y.o.  MRN: [id number]  Date of Evaluation: 10/11/2021 PCP: Eliberto Ivory, MD  Accompanied by:  self Patient Lives with: mother  HISTORY/CURRENT STATUS: HPI Patient here by herself for the visit. Patient interactive and appropriate with provider. Last f/u on 07/13/2021 with no changes recently. Attending college still and transferring to another college for a nursing major. Has continued on her current regimen of Evekeo for school, Prozac when she remembers, and Buspar PRN dosing.   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 Eating better over the holidays.  MVI/Other: None    Sleep: no current issues and sleeping well Concerns: Initiation/Maintenance/Other: None  Individual Medical History/ Review of Systems: Changes? :None recently  Allergies: Cantaloupe (diagnostic), Fish allergy, and Peanut butter flavor  Current Medications:  Current Outpatient Medications  Medication Instructions   albuterol (PROVENTIL) 2.5 mg, Nebulization, Every 6 hours PRN   albuterol (VENTOLIN HFA) 108 (90 Base) MCG/ACT inhaler 2 puffs, Inhalation, Every 6 hours PRN   Amphetamine Sulfate (EVEKEO) 20 mg, Oral, 2 times daily   busPIRone (BUSPAR) 10 MG tablet TAKE 1 TABLET BY MOUTH EVERY DAY   etonogestrel-ethinyl estradiol (NUVARING) 0.12-0.015 MG/24HR vaginal ring USE ONE  RING EVERY THREE WEEKS, AND THEN REMOVE FOR ONE WEEK   fexofenadine-pseudoephedrine (ALLEGRA-D) 60-120 MG 12 hr tablet 1 tablet, Oral, 2 times daily   FLUoxetine (PROZAC) 20 MG capsule Take 1 capsule 2 times daily   ipratropium-albuterol (DUONEB) 0.5-2.5 (3) MG/3ML SOLN 3 mLs, Nebulization, Every 6 hours PRN   ondansetron (ZOFRAN ODT) 4 mg, Oral, Every 8 hours PRN   traZODone (DESYREL) 200 mg, Oral, Daily at bedtime   Triamcinolone Acetonide (TRIAMCINOLONE 0.1 % CREAM : EUCERIN) CREA 1 application, Topical, 2 times daily   Medication Side Effects: None  Family Medical/ Social History: Changes? Yes MGM with worsening dementia  MENTAL HEALTH: Mental Health Issues: Depression and Anxiety  PHYSICAL EXAM; Vitals: There were no vitals taken for this visit.  General Physical Exam: Unchanged from previous exam, date:07/13/2021 Changed: None  DIAGNOSES:    ICD-10-CM   1. ADHD (attention deficit hyperactivity disorder), combined type  F90.2     2. Dyslexia  R48.0     3. Severe episode of recurrent major depressive disorder, without psychotic features (HCC)  F33.2     4. Generalized anxiety disorder  F41.1     5. Dysgraphia  R27.8     6. History of difficulty sleeping  Z72.821     7. Patient counseled  Z71.9     8. Medication management  Z79.899     9. Goals of care, counseling/discussion  Z71.89      ASSESSMENT: Deborah Cline is a 21 year old female with a history of ADHD, Learning, Anxiety and depression. She has been well controlled on her current medication regimen. Taking her Evekeo on school and her Buspar as needed for increased anxiety. Prozac is taken on occasion or when she remembers. Not currently taking her Trazodone and sleeping well.  Academically doing well with a GPA 3.1 after this past semester. Disability services on campus. To transfer to a nursing program. Eating has been up and down due to stressors from school. More tired recently and sleeping with no issues. No medical  changes reported since the last f/u. Encouraged medication adherence and no dose changes today.  RECOMMENDATIONS:  Updates with school, transferring, nursing program, and progress.  Academic support provided through disability services on campus.  Discussed transferring to another college for nursing and to apply for next year.   Eating habits discussed due to stress and school. Encouraged smaller meals and bringing snacks for quick meals between classes.  Working has continued at [employer] with walking due to witnessing. No other formal exercise. Discussed options for at home and apps to use for some motivation while in the house.   Sleep schedule and habits have been better with not needing medication for initiation.   May consider MVI daily and Vitamin D due to feeling more tired recently.   Discussed again with patient her history of marijuana use and limiting with current medications. History of asthma and history of flare ups.  Counseled medication pharmacokinetics, options, dosage, administration, desired effects, and possible side effects.   Evekeo 10 mg 1-2 BID, # 60 with no RF's Prozac 20 mg 1 BID, no Rx today Buspar 10 mg PRN for anxiety, no Rx today Not taking her Trazadone RX for above e-scribed and sent to pharmacy on record   [id number] [place] [facility], [place] - [address] [place] [address] Phone: [phone] Fax: [phone]  I discussed the assessment and treatment plan with the patient. The patient was provided an opportunity to ask questions and all were answered. The patient agreed with the plan and demonstrated an understanding of the instructions.  NEXT APPOINTMENT: Return in about 3 months (around 01/09/2022) for f/u visit .  The patient was advised to call back or seek an in-person evaluation if the symptoms worsen or if the condition fails to improve as anticipated.  Carron Curie, NP

## 2021-11-08 DIAGNOSIS — Z419 Encounter for procedure for purposes other than remedying health state, unspecified: Secondary | ICD-10-CM | POA: Diagnosis not present

## 2021-12-06 DIAGNOSIS — Z419 Encounter for procedure for purposes other than remedying health state, unspecified: Secondary | ICD-10-CM | POA: Diagnosis not present

## 2021-12-07 ENCOUNTER — Other Ambulatory Visit: Payer: Self-pay | Admitting: Nurse Practitioner

## 2021-12-07 DIAGNOSIS — H109 Unspecified conjunctivitis: Secondary | ICD-10-CM

## 2021-12-07 MED ORDER — POLYMYXIN B-TRIMETHOPRIM 10000-0.1 UNIT/ML-% OP SOLN: 2.0000 [drp] | OPHTHALMIC | 0 refills | Status: AC

## 2022-01-06 DIAGNOSIS — Z419 Encounter for procedure for purposes other than remedying health state, unspecified: Secondary | ICD-10-CM | POA: Diagnosis not present

## 2022-01-22 ENCOUNTER — Encounter: Payer: Self-pay | Admitting: Family

## 2022-01-22 ENCOUNTER — Telehealth (INDEPENDENT_AMBULATORY_CARE_PROVIDER_SITE_OTHER): Payer: Medicaid Other | Admitting: Family

## 2022-01-22 DIAGNOSIS — R110 Nausea: Secondary | ICD-10-CM

## 2022-01-22 DIAGNOSIS — G4700 Insomnia, unspecified: Secondary | ICD-10-CM

## 2022-01-22 DIAGNOSIS — Z79899 Other long term (current) drug therapy: Secondary | ICD-10-CM

## 2022-01-22 DIAGNOSIS — Z7189 Other specified counseling: Secondary | ICD-10-CM | POA: Diagnosis not present

## 2022-01-22 DIAGNOSIS — F1290 Cannabis use, unspecified, uncomplicated: Secondary | ICD-10-CM

## 2022-01-22 DIAGNOSIS — F902 Attention-deficit hyperactivity disorder, combined type: Secondary | ICD-10-CM

## 2022-01-22 DIAGNOSIS — R278 Other lack of coordination: Secondary | ICD-10-CM

## 2022-01-22 DIAGNOSIS — F819 Developmental disorder of scholastic skills, unspecified: Secondary | ICD-10-CM

## 2022-01-22 DIAGNOSIS — R480 Dyslexia and alexia: Secondary | ICD-10-CM | POA: Diagnosis not present

## 2022-01-22 DIAGNOSIS — F332 Major depressive disorder, recurrent severe without psychotic features: Secondary | ICD-10-CM

## 2022-01-22 DIAGNOSIS — Z719 Counseling, unspecified: Secondary | ICD-10-CM

## 2022-01-22 MED ORDER — ONDANSETRON 4 MG PO TBDP: 4.0000 mg | ORAL_TABLET | Freq: Three times a day (TID) | ORAL | 0 refills | Status: DC | PRN

## 2022-01-22 MED ORDER — BUSPIRONE HCL 10 MG PO TABS: 10.0000 mg | ORAL_TABLET | Freq: Every day | ORAL | 1 refills | Status: AC

## 2022-01-22 MED ORDER — AMPHETAMINE SULFATE 10 MG PO TABS: 20.0000 mg | ORAL_TABLET | Freq: Two times a day (BID) | ORAL | 0 refills | Status: AC

## 2022-01-22 NOTE — Progress Notes (Signed)
?[facility] ?[facility] ?[address] ?[place] [address] ?Dept: [phone] ?Dept Fax: [phone] ? ?Medication Check visit via Virtual Video  ? ?Patient ID:  Deborah Cline  female DOB: 02-26-2001   21 y.o.   MRN: [id number]  ? ?DATE:01/22/22 ? ?PCP: Eliberto Ivory, MD ? ?Virtual Visit via Video Note ? ?I connected with  Deborah Cline on 01/22/22 at  3:00 PM EDT by a video enabled telemedicine application and verified that I am speaking with the correct person using two identifiers. Patient/Parent Location: at home  ?  ?I discussed the limitations, risks, security and privacy concerns of performing an evaluation and management service by telephone and the availability of in person appointments. I also discussed with the parents that there may be a patient responsible charge related to this service. The parents expressed understanding and agreed to proceed. ? ?Provider: Carron Curie, NP  Location: private work location ? ?HPI/CURRENT STATUS: ?Deborah Cline is here for medication management of the psychoactive medications for ADHD and review of educational and behavioral concerns.  ? ?Deborah Cline currently taking her medication regimen inconsistencies, which is working well. Takes medication as directed daily. Medication tends to wear off around evening time.  Deborah Cline is able to focus through homework.  ? ?Deborah Cline is eating well (eating breakfast, lunch and dinner). Deborah Cline does not have appetite suppression, but ups and downs.  ? ?Sleeping well some nights but having issues falling asleep, sleeping through the night. Deborah Cline does have delayed sleep onset and using Buspar daily. Melatonin gummy at HS.  ? ?EDUCATION: ?School: [employer] ?To Attend [employer] next year for the Nursing program ?Year/Grade:  college   ?Performance/ Grades: average ?Services: Other: disability services.  ?Work; [employer] ?Hours:varied  depending on the week.  ?Activities/ Exercise: intermittently ? ?MEDICAL HISTORY: ?Individual Medical History/ Review of Systems: None  Has been healthy with no visits to the PCP. WCC due yearly and GYN for care.  ? ?Family Medical/ Social History: Changes? None  ?Patient Lives with: mother ? ?MENTAL HEALTH: ?Mental Health Issues:  Depression and Anxiety with ups and downs with emotions. Marijuana use on a regular basis.  ? ?Allergies: ?Allergies  ?Allergen Reactions  ? Cantaloupe (Diagnostic)   ? Fish Allergy   ? Peanut Butter Flavor   ? ?Current Medications:  ?Current Outpatient Medications  ?Medication Instructions  ? albuterol (PROVENTIL) 2.5 mg, Nebulization, Every 6 hours PRN  ? albuterol (VENTOLIN HFA) 108 (90 Base) MCG/ACT inhaler 2 puffs, Inhalation, Every 6 hours PRN  ? Amphetamine Sulfate (EVEKEO) 20 mg, Oral, 2 times daily  ? busPIRone (BUSPAR) 10 mg, Oral, Daily  ? etonogestrel-ethinyl estradiol (NUVARING) 0.12-0.015 MG/24HR vaginal ring USE ONE RING EVERY THREE WEEKS, AND THEN REMOVE FOR ONE WEEK  ? fexofenadine-pseudoephedrine (ALLEGRA-D) 60-120 MG 12 hr tablet 1 tablet, Oral, 2 times daily  ? FLUoxetine (PROZAC) 20 MG capsule Take 1 capsule 2 times daily  ? ipratropium-albuterol (DUONEB) 0.5-2.5 (3) MG/3ML SOLN 3 mLs, Nebulization, Every 6 hours PRN  ? ondansetron (ZOFRAN ODT) 4 mg, Oral, Every 8 hours PRN  ? Triamcinolone Acetonide (TRIAMCINOLONE 0.1 % CREAM : EUCERIN) CREA 1 application., Topical, 2 times daily  ? trimethoprim-polymyxin b (POLYTRIM) ophthalmic solution 2 drops, Right Eye, Every 4 hours  ? ?Medication Side Effects: None ? ?DIAGNOSES:  ?  ICD-10-CM   ?1. ADHD (attention deficit hyperactivity disorder), combined type  F90.2   ?  ?2. Severe episode of recurrent major depressive  disorder, without psychotic features (HCC)  F33.2   ?  ?3. Dyslexia  R48.0   ?  ?4. Insomnia, unspecified type  G47.00   ?  ?5. Learning difficulty  F81.9   ?  ?6. Dysgraphia  R27.8   ?  ?7. Medication  management  Z79.899   ?  ?8. Patient counseled  Z71.9   ?  ?9. Goals of care, counseling/discussion  Z71.89   ?  ?10. Nausea without vomiting  R11.0 ondansetron (ZOFRAN ODT) 4 MG disintegrating tablet  ?  ?11. Marijuana use  F12.90   ?  ? ?ASSESSMENT:      ?Deborah Cline is a 21 year old female with a history of ADHD, learning disability, Anxiety and Depression.  Deborah Cline has been well maintained on Evekeo, Prozac, and Buspar with good efficacy. Not being consistent with daily dosing of her medications. Academically doing well and getting services at school through disability services at [facility]. Accepted into the nursing program at [employer] next year. Has to apply for services on campus next year. ?Not currently getting counseling for her anxiety and depression. Still smoking marijuana on a regular basis and complaining of nauseas most mornings. No changes with eating, sleeping or health over the past few months. Consistency with medications reviewed along with continued use of marijuana.  ? ?PLAN/RECOMMENDATIONS:  ?Updates for school. academics, progress and recent grades for her classes.  ? ?Discussed services on campus and applying for services next year at [employer]. ? ?Encouraged living on campus along with suggested for acclimating to the environment. ? ?Executive functioning and time management skills discussed to assist with academic success.  ? ?Cannabis hyperemesis syndrome (CHS) discussed with long term use and current symptoms described by patient.  ? ?Support given for smoking cessation, but not willing to give up the use of marijuana until nursing school. ? ?Sleep schedule and use of medications at HS discussed with need for daily routine with sleep/wake cycle. ? ?Discussed current work and environment with options for alternative work outside of [employer].  ? ?Medication management with consistency discussed with patient today for daily efficacy. ? ?Counseled medication pharmacokinetics, options,  dosage, administration, desired effects, and possible side effects.   ?Evekeo 10 mg 20 mg BID, # 120 with no RF's ?Prozac 20 mg BID daily, no Rx today ?Buspar 10 mg daily, # 90 with 1 RF's ?Zofran ODT 4 mg daily, # 20 with no RF's ?RX for above e-scribed and sent to pharmacy on record ? ?[id number] [place], [place] - [address] ?[address] ?[place] [address] ?Phone: [phone] Fax: [phone] ? ?I discussed the assessment and treatment plan with the patient. The patient was provided an opportunity to ask questions and all were answered. The patient agreed with the plan and demonstrated an understanding of the instructions. ?  ?NEXT APPOINTMENT:  ?Visit date not found-needs f/u visit ?Telehealth OK ? ?The patient was advised to call back or seek an in-person evaluation if the symptoms worsen or if the condition fails to improve as anticipated. ? ?Carron Curie, NP ? ?

## 2022-01-23 ENCOUNTER — Encounter: Payer: Self-pay | Admitting: Family

## 2022-02-05 DIAGNOSIS — Z419 Encounter for procedure for purposes other than remedying health state, unspecified: Secondary | ICD-10-CM | POA: Diagnosis not present

## 2022-02-28 ENCOUNTER — Telehealth: Payer: Self-pay | Admitting: Family

## 2022-02-28 NOTE — Telephone Encounter (Signed)
  Emailed letter to [employer].

## 2022-03-07 ENCOUNTER — Encounter: Payer: Self-pay | Admitting: Family

## 2022-03-08 DIAGNOSIS — Z419 Encounter for procedure for purposes other than remedying health state, unspecified: Secondary | ICD-10-CM | POA: Diagnosis not present

## 2022-03-19 DIAGNOSIS — Z131 Encounter for screening for diabetes mellitus: Secondary | ICD-10-CM | POA: Diagnosis not present

## 2022-03-19 DIAGNOSIS — Z130 Encounter for screening for diseases of the blood and blood-forming organs and certain disorders involving the immune mechanism: Secondary | ICD-10-CM | POA: Diagnosis not present

## 2022-04-07 DIAGNOSIS — Z419 Encounter for procedure for purposes other than remedying health state, unspecified: Secondary | ICD-10-CM | POA: Diagnosis not present

## 2022-04-24 ENCOUNTER — Other Ambulatory Visit: Payer: Self-pay | Admitting: Family

## 2022-04-24 DIAGNOSIS — Z3049 Encounter for surveillance of other contraceptives: Secondary | ICD-10-CM

## 2022-05-01 ENCOUNTER — Encounter: Payer: Medicaid Other | Admitting: Family

## 2022-05-02 ENCOUNTER — Encounter: Payer: Self-pay | Admitting: Family

## 2022-05-02 ENCOUNTER — Telehealth (INDEPENDENT_AMBULATORY_CARE_PROVIDER_SITE_OTHER): Payer: Medicaid Other | Admitting: Family

## 2022-05-02 DIAGNOSIS — F902 Attention-deficit hyperactivity disorder, combined type: Secondary | ICD-10-CM | POA: Diagnosis not present

## 2022-05-02 DIAGNOSIS — Z7189 Other specified counseling: Secondary | ICD-10-CM

## 2022-05-02 DIAGNOSIS — F411 Generalized anxiety disorder: Secondary | ICD-10-CM

## 2022-05-02 DIAGNOSIS — F332 Major depressive disorder, recurrent severe without psychotic features: Secondary | ICD-10-CM

## 2022-05-02 DIAGNOSIS — R480 Dyslexia and alexia: Secondary | ICD-10-CM

## 2022-05-02 DIAGNOSIS — Z72821 Inadequate sleep hygiene: Secondary | ICD-10-CM | POA: Diagnosis not present

## 2022-05-02 DIAGNOSIS — R278 Other lack of coordination: Secondary | ICD-10-CM | POA: Diagnosis not present

## 2022-05-02 DIAGNOSIS — F819 Developmental disorder of scholastic skills, unspecified: Secondary | ICD-10-CM | POA: Diagnosis not present

## 2022-05-02 DIAGNOSIS — Z719 Counseling, unspecified: Secondary | ICD-10-CM | POA: Diagnosis not present

## 2022-05-02 DIAGNOSIS — F1290 Cannabis use, unspecified, uncomplicated: Secondary | ICD-10-CM

## 2022-05-02 DIAGNOSIS — R110 Nausea: Secondary | ICD-10-CM

## 2022-05-02 DIAGNOSIS — Z79899 Other long term (current) drug therapy: Secondary | ICD-10-CM | POA: Diagnosis not present

## 2022-05-02 MED ORDER — ONDANSETRON 4 MG PO TBDP: 4.0000 mg | ORAL_TABLET | Freq: Three times a day (TID) | ORAL | 0 refills | Status: AC | PRN

## 2022-05-02 NOTE — Progress Notes (Signed)
[facility] [address], [place]. [address] [place] [address] Dept: [phone] Dept Fax: [phone]  Medication Check visit via Virtual Video   Patient ID:  Deborah Cline  female DOB: 02/03/2001   21 y.o.   MRN: [id number]   DATE:05/02/22  PCP: Eliberto Ivory, MD  Virtual Visit via Video Note  I connected with  Deborah Cline on 05/02/22 at  1:00 PM EDT by a video enabled telemedicine application and verified that I am speaking with the correct person using two identifiers. Patient/Parent Location: at home  I discussed the limitations, risks, security and privacy concerns of performing an evaluation and management service by telephone and the availability of in person appointments. I also discussed with the parents that there may be a patient responsible charge related to this service. The parents expressed understanding and agreed to proceed.  Provider: Carron Curie, NP  Location: work location  HPI/CURRENT STATUS: Deborah Cline is here for medication management of the psychoactive medications for ADHD and review of educational and behavioral concerns.   Deborah Cline currently taking no medication at this time, which is working well. Takes medication on and off with inconsistencies. Medication is taken for school and tends to last for a few hours during the day for her Evekeo. Deborah Cline is able to focus through school & homework when she takes her medication.    Deborah Cline is eating well (eating breakfast, lunch and dinner). Deborah Cline does not have appetite suppression, but some nausea Cline thing in the morning.   Sleeping well (getting enough sleep each night), sleeping through the night. Deborah Cline does have some delayed sleep onset and taking frequent naps. Trying now to nap all day while in the bed.   EDUCATION: School: [employer] Starts on August 15th  Year/Grade:   transfer student   Performance/ Grades: above average-A's and C in Chemistry Services: Other: disability services Work:  [employer]  Activities/ Exercise: intermittently  MEDICAL HISTORY: Individual Medical History/ Review of Systems: Yes,   Has been healthy with no visits to the PCP. WCC due yearly.   Family Medical/ Social History: BF, Laban Emperor, is not currently living in the house with patient.  Patient Lives with: mother  MENTAL HEALTH: Mental Health Issues: Depression, Anxiety, and Substance Abuse Not taking her medication regularly and using marijuana with less use recently. Attempting to stop due to wanting to get into nursing school.   Allergies: Allergies  Allergen Reactions   Cantaloupe (Diagnostic)    Fish Allergy    Peanut Butter Flavor    Current Medications:  Current Outpatient Medications  Medication Instructions   albuterol (PROVENTIL) 2.5 mg, Nebulization, Every 6 hours PRN   albuterol (VENTOLIN HFA) 108 (90 Base) MCG/ACT inhaler 2 puffs, Inhalation, Every 6 hours PRN   Amphetamine Sulfate (EVEKEO) 20 mg, Oral, 2 times daily   busPIRone (BUSPAR) 10 mg, Oral, Daily   etonogestrel-ethinyl estradiol (NUVARING) 0.12-0.015 MG/24HR vaginal ring USE ONE RING EVERY THREE WEEKS, AND THEN REMOVE FOR ONE WEEK   fexofenadine-pseudoephedrine (ALLEGRA-D) 60-120 MG 12 hr tablet 1 tablet, Oral, 2 times daily   FLUoxetine (PROZAC) 20 MG capsule Take 1 capsule 2 times daily   ipratropium-albuterol (DUONEB) 0.5-2.5 (3) MG/3ML SOLN 3 mLs, Nebulization, Every 6 hours PRN   ondansetron (ZOFRAN ODT) 4 mg, Oral, Every 8 hours PRN   Triamcinolone Acetonide (TRIAMCINOLONE 0.1 % CREAM : EUCERIN) CREA 1 application , Topical, 2 times daily   trimethoprim-polymyxin b (  POLYTRIM) ophthalmic solution 2 drops, Right Eye, Every 4 hours   Medication Side Effects: None  DIAGNOSES:    ICD-10-CM   1. ADHD (attention deficit hyperactivity disorder), combined type  F90.2     2. Dyslexia  R48.0     3.  Severe episode of recurrent major depressive disorder, without psychotic features (HCC)  F33.2     4. Generalized anxiety disorder  F41.1     5. Learning difficulty  F81.9     6. Dysgraphia  R27.8     7. History of difficulty sleeping  Z72.821     8. Patient counseled  Z71.9     9. Medication management  Z79.899     10. Goals of care, counseling/discussion  Z71.89     11. Marijuana use  F12.90      ASSESSMENT:   Deborah Cline is a 21 year old female with a history of ADHD, Anxiety, Depression, Learning and Sleep issues reported. Not currently taking her medications for her ADHD, Anxiety or Depression. Per patient she is taking a break for the sumner and to restart for school. Academically did well and transferring to [employer] for nursing next year. Has disability services on campus and did not get approved for housing accommodations. This has caused more anxiety about changing school environments and may continue to live at home. Eating is on and off during the day. Laying in the bed most of the day and napping off and on causing issues with sleep initiation at night. No changes with health in the past few months. Has continued in a relationship with BF and use of marijuana that has decreased significantly. Encouraged restarting her Prozac with instructions provided for the next few weeks.   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  Accommodations for academics and housing discussed with patient for the fall semester.   Discussed registration with classes for the fall semester and credits for transferring from [employer].   Campus living accommodations not approved and anxious about living on campus with another student.   Anxiety has been significant and not getting counseling services at this time, but recommended to restart.   Eating varies and discussed better eating habits.   Follow through with completion of needed  items and failure to follow up with forms.  Sleep schedule with inconsistencies for going to bed due to napping and in her bed most of the day.   Medication management discussed with patient and inconsistencies with her daily regimen.  Counseled medication pharmacokinetics, options, dosage, administration, desired effects, and possible side effects.  Restart medications before school with instructions.  Evekeo 10 mg 2 tablets BID, no Rx today Prozac 20 mg 2 capsule daily, no Rx today Buspar 10 mg PRN with no Rx today Zofran DT 4 mg PRN, # 20 with no RF's.RX for above e-scribed and sent to pharmacy on record  [id number] [place], [place] - [address] [place] [address] Phone: [phone] Fax: [phone]  I discussed the assessment and treatment plan with the patient. The patient was provided an opportunity to ask questions and all were answered. The patient agreed with the plan and demonstrated an understanding of the instructions.   NEXT APPOINTMENT:  Visit date not found-f/u needed for 3 months Telehealth OK  The patient was advised to call back or seek an in-person evaluation if the symptoms worsen or if the condition fails to improve as anticipated.  Carolann Littler, NP

## 2022-05-08 DIAGNOSIS — Z419 Encounter for procedure for purposes other than remedying health state, unspecified: Secondary | ICD-10-CM | POA: Diagnosis not present

## 2022-06-08 DIAGNOSIS — Z419 Encounter for procedure for purposes other than remedying health state, unspecified: Secondary | ICD-10-CM | POA: Diagnosis not present

## 2022-06-19 DIAGNOSIS — H1045 Other chronic allergic conjunctivitis: Secondary | ICD-10-CM | POA: Diagnosis not present

## 2022-07-08 DIAGNOSIS — Z419 Encounter for procedure for purposes other than remedying health state, unspecified: Secondary | ICD-10-CM | POA: Diagnosis not present

## 2022-08-08 DIAGNOSIS — Z419 Encounter for procedure for purposes other than remedying health state, unspecified: Secondary | ICD-10-CM | POA: Diagnosis not present

## 2022-09-07 DIAGNOSIS — Z419 Encounter for procedure for purposes other than remedying health state, unspecified: Secondary | ICD-10-CM | POA: Diagnosis not present

## 2022-10-08 DIAGNOSIS — Z419 Encounter for procedure for purposes other than remedying health state, unspecified: Secondary | ICD-10-CM | POA: Diagnosis not present

## 2022-10-12 ENCOUNTER — Telehealth: Payer: Self-pay | Admitting: Internal Medicine

## 2022-10-12 MED ORDER — MOLNUPIRAVIR 200 MG PO CAPS: 4.0000 | ORAL_CAPSULE | Freq: Two times a day (BID) | ORAL | 0 refills | Status: AC

## 2022-10-12 NOTE — Telephone Encounter (Signed)
Tested positive Covid infection Sending molnupiravir

## 2022-10-16 NOTE — Telephone Encounter (Signed)
Covid positive- sending molnupiravir

## 2022-11-08 DIAGNOSIS — Z419 Encounter for procedure for purposes other than remedying health state, unspecified: Secondary | ICD-10-CM | POA: Diagnosis not present

## 2022-12-07 DIAGNOSIS — Z419 Encounter for procedure for purposes other than remedying health state, unspecified: Secondary | ICD-10-CM | POA: Diagnosis not present

## 2022-12-26 DIAGNOSIS — Z6822 Body mass index (BMI) 22.0-22.9, adult: Secondary | ICD-10-CM | POA: Diagnosis not present

## 2022-12-26 DIAGNOSIS — Z202 Contact with and (suspected) exposure to infections with a predominantly sexual mode of transmission: Secondary | ICD-10-CM | POA: Diagnosis not present

## 2022-12-26 DIAGNOSIS — Z717 Human immunodeficiency virus [HIV] counseling: Secondary | ICD-10-CM | POA: Diagnosis not present

## 2022-12-26 DIAGNOSIS — Z708 Other sex counseling: Secondary | ICD-10-CM | POA: Diagnosis not present

## 2022-12-27 DIAGNOSIS — Z708 Other sex counseling: Secondary | ICD-10-CM | POA: Diagnosis not present

## 2023-01-07 DIAGNOSIS — Z419 Encounter for procedure for purposes other than remedying health state, unspecified: Secondary | ICD-10-CM | POA: Diagnosis not present

## 2023-01-18 DIAGNOSIS — Z7253 High risk bisexual behavior: Secondary | ICD-10-CM | POA: Diagnosis not present

## 2023-01-18 DIAGNOSIS — Z113 Encounter for screening for infections with a predominantly sexual mode of transmission: Secondary | ICD-10-CM | POA: Diagnosis not present

## 2023-02-06 DIAGNOSIS — Z419 Encounter for procedure for purposes other than remedying health state, unspecified: Secondary | ICD-10-CM | POA: Diagnosis not present

## 2023-03-09 DIAGNOSIS — Z419 Encounter for procedure for purposes other than remedying health state, unspecified: Secondary | ICD-10-CM | POA: Diagnosis not present

## 2023-04-08 DIAGNOSIS — Z419 Encounter for procedure for purposes other than remedying health state, unspecified: Secondary | ICD-10-CM | POA: Diagnosis not present

## 2023-05-09 DIAGNOSIS — Z419 Encounter for procedure for purposes other than remedying health state, unspecified: Secondary | ICD-10-CM | POA: Diagnosis not present

## 2023-05-17 DIAGNOSIS — Z6821 Body mass index (BMI) 21.0-21.9, adult: Secondary | ICD-10-CM | POA: Diagnosis not present

## 2023-05-17 DIAGNOSIS — Z708 Other sex counseling: Secondary | ICD-10-CM | POA: Diagnosis not present

## 2023-05-23 DIAGNOSIS — Z202 Contact with and (suspected) exposure to infections with a predominantly sexual mode of transmission: Secondary | ICD-10-CM | POA: Diagnosis not present

## 2023-05-23 DIAGNOSIS — Z708 Other sex counseling: Secondary | ICD-10-CM | POA: Diagnosis not present

## 2023-08-09 DIAGNOSIS — Z419 Encounter for procedure for purposes other than remedying health state, unspecified: Secondary | ICD-10-CM | POA: Diagnosis not present

## 2023-09-08 DIAGNOSIS — Z419 Encounter for procedure for purposes other than remedying health state, unspecified: Secondary | ICD-10-CM | POA: Diagnosis not present

## 2023-10-09 DIAGNOSIS — Z419 Encounter for procedure for purposes other than remedying health state, unspecified: Secondary | ICD-10-CM | POA: Diagnosis not present

## 2023-11-09 DIAGNOSIS — Z419 Encounter for procedure for purposes other than remedying health state, unspecified: Secondary | ICD-10-CM | POA: Diagnosis not present

## 2023-12-02 ENCOUNTER — Encounter: Payer: Self-pay | Admitting: Pediatrics

## 2023-12-07 DIAGNOSIS — Z419 Encounter for procedure for purposes other than remedying health state, unspecified: Secondary | ICD-10-CM | POA: Diagnosis not present

## 2024-01-18 DIAGNOSIS — Z419 Encounter for procedure for purposes other than remedying health state, unspecified: Secondary | ICD-10-CM | POA: Diagnosis not present

## 2024-02-03 NOTE — Progress Notes (Unsigned)
 [facility] Initial Consultation   Referring Provider Marylouise Socks, MD [address] [facility], [place]. [place],  [place] [address]  Primary Care Provider Marylouise Socks, MD  Patient Profile: Deborah Cline is a 23 y.o. female who is seen in consultation in the [facility] at the request of Dr. Fulton Job for evaluation and management of the problem(s) noted below.  Problem List: Anal fissures Perianal skin tag Constipation Left lower quadrant abdominal pain   History of Present Illness   Deborah Cline is a 23 y.o. female with a history of anxiety, depression, and asthma, who presents today for concern of possible anal fissure vs hemorrhoids.  She reports having intermittent episodes of anal discomfort and pain over the past year. She has a sensation of perianal swelling as well as tearing pain. When she has a "flare up" it can be painful to sit and have a bowel movement. She will occasionally notice a small spot of blood on the toilet paper but does not see any blood on the stool or in the toilet. She thinks stress may possibly be a trigger for these flares, otherwise she cannot think of any other inciting factors.  She has never had a rectal exam. Her mother is an NP and did give her a prednisone shot during one of her flares, which improved her symptoms and allowed her to sit more comfortably.   She has not had a flare in about 2 months but does still feel that there is some skin tearing.  She has a bowel movement at least once, sometimes twice a day without straining, though occasionally she will have a hard stool and does admit to not drinking very much water throughout the day. She is a vegetarian and says she eats a lot of fruits, vegetables, and other fiber-rich foods.  Denies any family history of colorectal cancer or IBD.   She does report some intermittent LLQ abdominal pain which has been ongoing for 4+ years. She  had a pelvic US  in 2021 to evaluate this which was unrevealing and limited by bowel gas. A transvaginal pelvic US  was recommended but she has not had any additional imaging for this. She cannot identify any triggers for the pain. It appears to come on at random and is unrelated to bowel movements.  She uses a NuvaRing  for birth control, follows with her PCP for this.  She reports having blood work within the last year which was normal.   No prior endoscopic procedures.  No prior abdominal CT/MRI.  GI Review of Symptoms Significant for intermittent LLQ abdominal pain, rectal pain, rectal bleeding. Otherwise negative.  General Review of Systems  Review of systems is significant for the pertinent positives and negatives as listed per the HPI.  Full ROS is otherwise negative.  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   Asthma    Depression    Dyslexia    Environmental and seasonal allergies    Headache    Insomnia 07/07/2015   Syncope and collapse      Past Surgical History   Past Surgical History:  Procedure Laterality Date   ADENOIDECTOMY       Allergies and Medications   Allergies  Allergen Reactions   Cantaloupe (Diagnostic)    Fish Allergy    Peanut Butter Flavoring Agent (Non-Screening)      Family History  Family History  Problem Relation Age of Onset   Mental illness Maternal Aunt    Depression Mother      Social History   Social History   Tobacco Use   Smoking status: Never   Smokeless tobacco: Never  Vaping Use   Vaping status: Some Days   Substances: Flavoring  Substance Use Topics   Alcohol use: No   Drug use: No   [place] reports that she has never smoked. She has never used smokeless tobacco. She reports that she does not drink alcohol and does not use drugs.  Vital Signs and Physical Examination  BP 118/62   Pulse 94   Ht 5\' 11"   (1.803 m)   Wt 142 lb (64.4 kg)   BMI 19.80 kg/m    General: Well developed, well nourished, no acute distress Head: Normocephalic and atraumatic Eyes: Sclerae anicteric, EOMI Lungs: Clear throughout to auscultation Heart: Regular rate and rhythm; No murmurs, rubs or bruits Abdomen: Soft, non tender and non distended. No masses, hepatosplenomegaly or hernias noted. Normal Bowel sounds Rectal: Perianal skin tag noted at anterior midline. Left lateral and anterior midline fissures noted. No visible bleeding. Possible internal hemorrhoids noted on DRE. Musculoskeletal: Symmetrical with no gross deformities    Review of Data  The following data was reviewed at the time of this encounter:  Laboratory Studies      Latest Ref Rng & Units 07/06/2015    4:27 PM  CBC  WBC 4.5 - 13.5 K/uL 8.6   Hemoglobin 11.0 - 14.6 g/dL 09.8   Hematocrit 11.9 - 44.0 % 39.8   Platelets 150 - 400 K/uL 250     No results found for: "LIPASE"    Latest Ref Rng & Units 07/06/2015    4:27 PM  CMP  Glucose 65 - 99 mg/dL 94   BUN 6 - 20 mg/dL 8   Creatinine 1.47 - 8.29 mg/dL 5.62   Sodium 130 - 865 mmol/L 139   Potassium 3.5 - 5.1 mmol/L 3.5   Chloride 101 - 111 mmol/L 105   CO2 22 - 32 mmol/L 24   Calcium 8.9 - 10.3 mg/dL 9.8   Total Protein 6.5 - 8.1 g/dL 9.1   Total Bilirubin 0.3 - 1.2 mg/dL 1.1   Alkaline Phos 50 - 162 U/L 69   AST 15 - 41 U/L 22   ALT 14 - 54 U/L 13      Imaging Studies  None  GI Procedures and Studies  None  Clinical Impression  It is my clinical impression that Deborah Cline is a 23 y.o. female with;  Anal fissures Perianal skin tag Constipation  Left lower quadrant abdominal pain  Deborah Cline presents for evaluation and management of anorectal discomfort with concern for anal fissure/hemorrhoids as well as constipation.  She reports a history of chronic anorectal discomfort over the last year that comes and goes.  Reports a history of constipation with intermittent  straining.  Describes having a "swelling" in the perianal region.  On physical examination today there is evidence of 2 anal fissures as well as what appears to be an anterior skin tag.  Reviewed that given the chronicity of her symptoms it would be appropriate to perform a basic evaluation for Crohn's disease to ensure these are not a manifestation of perianal Crohn's.  We discussed the use of low-dose daily MiraLAX to better regulate her bowel movements and avoid straining.  She was previously advised by OB/GYN to have a follow-up transvaginal  ultrasound regarding her left lower quadrant abdominal pain.  We have recommended that she proceed with this.  If pain worsens can also consider a CT scan of the abdomen and pelvis.  Plan  Will treat anal fissures with topical diltiazem 2% + lidocaine 5% compound cream for 6-8 weeks. Will send topical hydrocortisone cream 2.5% to be applied to perianal skin tag as needed when she experiences discomfort/swelling. Check labs today to assess for possible early Crohn's disease: CBC, CMP, ESR, CRP, fecal calprotectin. If these come back abnormal, would consider cross-sectional imaging and possibly colonoscopy. Recommend taking low dose of Miralax daily or every other day to avoid hard stools and straining. Could also take a stool softener. Increase water intake. Recommend patient have follow up transvaginal US  to evaluate intermittent LLQ pain. If this persists or worsens we could also order a CT A/P. She has not had pain in a few months. Follow up in clinic to assess healing of fissures and discuss need for further work-up.   Planned Follow Up Follow-up 6 weeks with MD or APP.  The patient or caregiver verbalized understanding of the material covered, with no barriers to understanding. All questions were answered. Patient or caregiver is agreeable with the plan outlined above.    It was a pleasure to see Deborah Cline.  If you have any questions or concerns regarding  this evaluation, do not hesitate to contact me.  Eugenia Hess, MD [facility]

## 2024-02-04 ENCOUNTER — Other Ambulatory Visit (INDEPENDENT_AMBULATORY_CARE_PROVIDER_SITE_OTHER)

## 2024-02-04 ENCOUNTER — Encounter: Payer: Self-pay | Admitting: Pediatrics

## 2024-02-04 ENCOUNTER — Ambulatory Visit: Payer: Medicaid Other | Admitting: Pediatrics

## 2024-02-04 VITALS — BP 118/62 | HR 94 | Ht 71.0 in | Wt 142.0 lb

## 2024-02-04 DIAGNOSIS — R109 Unspecified abdominal pain: Secondary | ICD-10-CM | POA: Diagnosis not present

## 2024-02-04 DIAGNOSIS — R1032 Left lower quadrant pain: Secondary | ICD-10-CM | POA: Diagnosis not present

## 2024-02-04 DIAGNOSIS — K644 Residual hemorrhoidal skin tags: Secondary | ICD-10-CM

## 2024-02-04 DIAGNOSIS — K602 Anal fissure, unspecified: Secondary | ICD-10-CM

## 2024-02-04 DIAGNOSIS — K5900 Constipation, unspecified: Secondary | ICD-10-CM

## 2024-02-04 LAB — CBC
HCT: 40.8 % (ref 36.0–46.0)
Hemoglobin: 13.2 g/dL (ref 12.0–15.0)
MCHC: 32.4 g/dL (ref 30.0–36.0)
MCV: 82.4 fl (ref 78.0–100.0)
Platelets: 257 10*3/uL (ref 150.0–400.0)
RBC: 4.96 Mil/uL (ref 3.87–5.11)
RDW: 14.4 % (ref 11.5–15.5)
WBC: 7.1 10*3/uL (ref 4.0–10.5)

## 2024-02-04 LAB — COMPREHENSIVE METABOLIC PANEL WITH GFR
ALT: 8 U/L (ref 0–35)
AST: 12 U/L (ref 0–37)
Albumin: 4.5 g/dL (ref 3.5–5.2)
Alkaline Phosphatase: 41 U/L (ref 39–117)
BUN: 8 mg/dL (ref 6–23)
CO2: 28 meq/L (ref 19–32)
Calcium: 9.6 mg/dL (ref 8.4–10.5)
Chloride: 102 meq/L (ref 96–112)
Creatinine, Ser: 0.64 mg/dL (ref 0.40–1.20)
GFR: 123.96 mL/min (ref >60.00)
Glucose, Bld: 77 mg/dL (ref 70–99)
Potassium: 4.1 meq/L (ref 3.5–5.1)
Sodium: 136 meq/L (ref 135–145)
Total Bilirubin: 0.5 mg/dL (ref 0.2–1.2)
Total Protein: 8.5 g/dL — ABNORMAL HIGH (ref 6.0–8.3)

## 2024-02-04 LAB — C-REACTIVE PROTEIN: CRP: <1 mg/dL (ref 0.5–20.0)

## 2024-02-04 LAB — SEDIMENTATION RATE: Sed Rate: 16 mm/h (ref 0–20)

## 2024-02-04 MED ORDER — DILTIAZEM GEL 2 %: 1.0000 | Freq: Three times a day (TID) | CUTANEOUS | 1 refills | Status: AC

## 2024-02-04 MED ORDER — HYDROCORTISONE (PERIANAL) 2.5 % EX CREA: 1.0000 | TOPICAL_CREAM | Freq: Two times a day (BID) | CUTANEOUS | 1 refills | Status: AC

## 2024-02-04 NOTE — Patient Instructions (Addendum)
 Your provider has requested that you go to the basement level for lab work before leaving today. Press "B" on the elevator. The lab is located at the first door on the left as you exit the elevator.  Due to recent changes in healthcare laws, you may see the results of your imaging and laboratory studies on MyChart before your provider has had a chance to review them.  We understand that in some cases there may be results that are confusing or concerning to you. Not all laboratory results come back in the same time frame and the provider may be waiting for multiple results in order to interpret others.  Please give us  48 hours in order for your provider to thoroughly review all the results before contacting the office for clarification of your results.   We have sent the following medications to your pharmacy for you to pick up at your convenience: Diltiazem 2%/ Lidocaine 5% cream anal fissures.  Apply three times a day for 6-8 weeks.  Hydrocortisone Cream 2.5%,  apply sparingly, up to two twice daily for skin tag.  (No more than 5-7 days at a time).  While treating anal fissures, the goal is to keep the stool soft and to avoid straining with bowel movements. You can take a gentle laxative like Miralax, start with 1/2 capful daily or every other day. You could also take an over-the-counter stool softener. Try to increase your fluid intake, drink plenty of water throughout the day.   Follow up in 6 weeks.  Thank you for entrusting me with your care and for choosing [facility],  Dr. Eugenia Hess   _______________________________________________________  If your blood pressure at your visit was 140/90 or greater, please contact your primary care physician to follow up on this.  _______________________________________________________  If you are age 14 or older, your body mass index should be between 23-30. Your Body mass index is 19.8 kg/m. If this is out of the aforementioned range  listed, please consider follow up with your Primary Care Provider.  If you are age 78 or younger, your body mass index should be between 19-25. Your Body mass index is 19.8 kg/m. If this is out of the aformentioned range listed, please consider follow up with your Primary Care Provider.   ________________________________________________________  The [facility] providers would like to encourage you to use MYCHART to communicate with providers for non-urgent requests or questions.  Due to long hold times on the telephone, sending your provider a message by [facility] may be a faster and more efficient way to get a response.  Please allow 48 business hours for a response.  Please remember that this is for non-urgent requests.  _______________________________________________________

## 2024-02-05 ENCOUNTER — Encounter: Payer: Self-pay | Admitting: Pediatrics

## 2024-02-11 ENCOUNTER — Other Ambulatory Visit

## 2024-02-11 DIAGNOSIS — R109 Unspecified abdominal pain: Secondary | ICD-10-CM

## 2024-02-11 DIAGNOSIS — K602 Anal fissure, unspecified: Secondary | ICD-10-CM | POA: Diagnosis not present

## 2024-02-11 DIAGNOSIS — K644 Residual hemorrhoidal skin tags: Secondary | ICD-10-CM | POA: Diagnosis not present

## 2024-02-11 DIAGNOSIS — K5900 Constipation, unspecified: Secondary | ICD-10-CM

## 2024-02-13 ENCOUNTER — Encounter: Payer: Self-pay | Admitting: Pediatrics

## 2024-02-13 LAB — CALPROTECTIN, FECAL: Calprotectin, Fecal: 106 ug/g (ref 0–120)

## 2024-02-17 DIAGNOSIS — Z419 Encounter for procedure for purposes other than remedying health state, unspecified: Secondary | ICD-10-CM | POA: Diagnosis not present

## 2024-03-18 ENCOUNTER — Telehealth: Payer: Self-pay

## 2024-03-18 NOTE — Telephone Encounter (Signed)
 Called patient to schedule new patient appointment. Left voicemail with our contact information to call back and schedule.

## 2024-04-16 NOTE — Progress Notes (Signed)
 [facility] Return Visit   Referring Provider Gretta Elsie BIRCH, MD [address] [facility], [place],  [place] [address]  Primary Care Provider Gretta Elsie BIRCH, MD  Patient Profile: Deborah Cline is a 23 y.o. female with a past medical history noteworthy for anxiety, depression and asthma who returns to the [facility] office for follow-up of the problem(s) noted below.  Problem List: Anal fissures Perianal skin tag Constipation Left lower quadrant abdominal pain   History of Present Illness   Deborah Cline is a 23 y.o. female with a history of anxiety, depression, and asthma, who returns to the office for follow-up of anal fissure, perianal skin tag, constipation and abdominal pain  History of Present Illness   Deborah Cline was last seen in the GI office 02/04/2024  Current GI Meds  Diltiazem  2% + lidocaine 5% compound cream Hydrocortisone  cream 2.5%   Interval History   At the time of Deborah Cline's initial consultation for 2025, she reports having intermittent episodes of anal discomfort and pain over the past year. She described a sensation of perianal swelling as well as tearing pain.  When she has a flare up it can be painful to sit and have a bowel movement.  She occasionally noticed a small spot of blood on the toilet paper but does not see any blood on the stool or in the toilet.  Perianal inspection showed a perianal skin tag noted at anterior midline. Left lateral and anterior midline fissures noted. No visible bleeding. Possible internal hemorrhoids noted on DRE. Labs showed normal CBC, CMP, ESR and CRP Fecal calprotectin was borderline elevated at 106 She was prescribed topical treatment with diltiazem  for anal fissure and lidocaine cream as well as hydrocortisone  cream for perianal skin tag Advised to use MiraLAX and stool softeners  At today's visit reports that her constipation is improved with the use  of stool softeners and hydration Bowel movements are no longer hard She is having 2 bowel movements per day Denies hematochezia  She continues to endorse pain from anal fissure which may have worsened in the area between her rectum and vagina States that she primarily experiences discomfort when wiping her perianal area after defecation  Denies any family history of colorectal cancer or IBD.   At last visit she reported some intermittent LLQ abdominal pain which has been ongoing for 4+ years. She had a pelvic US  in 2021 to evaluate this which was unrevealing and limited by bowel gas. A transvaginal pelvic US  was recommended but she has not had any additional imaging for this. She cannot identify any triggers for the pain. It appears to come on at random and is unrelated to bowel movements.  No prior endoscopic procedures.  No prior abdominal CT/MRI.  GI Review of Symptoms Significant for intermittent LLQ abdominal pain, rectal pain, rectal bleeding. Otherwise negative.  General Review of Systems  Review of systems is significant for the pertinent positives and negatives as listed per the HPI.  Full ROS is otherwise negative.  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   albuterol   (VENTOLIN  HFA) 108 (90 Base) MCG/ACT inhaler Inhale 2 puffs into the lungs every 6 (six) hours as needed for wheezing or shortness of breath.   Amphetamine  Sulfate (EVEKEO ) 10 MG TABS Take 20 mg by mouth 2 (two) times daily.   busPIRone  (BUSPAR ) 10 MG tablet Take 1 tablet (10 mg total) by mouth daily.   diltiazem  2 % GEL Apply 1 Application topically 3 (three) times daily.   etonogestrel-ethinyl estradiol (NUVARING ) 0.12-0.015 MG/24HR vaginal ring USE ONE RING EVERY THREE WEEKS, AND THEN REMOVE FOR ONE WEEK   fexofenadine -pseudoephedrine (ALLEGRA-D) 60-120 MG 12 hr tablet Take 1 tablet by mouth 2 (two) times daily.   FLUoxetine  (PROZAC ) 20 MG capsule Take 1 capsule 2 times daily   hydrocortisone  (ANUSOL -HC) 2.5 % rectal cream Place 1 Application rectally 2 (two) times daily.   ipratropium-albuterol  (DUONEB) 0.5-2.5 (3) MG/3ML SOLN Take 3 mLs by nebulization every 6 (six) hours as needed.   ondansetron  (ZOFRAN  ODT) 4 MG disintegrating tablet Take 1 tablet (4 mg total) by mouth every 8 (eight) hours as needed for nausea or vomiting.   traZODone  (DESYREL ) 100 MG tablet Take 200 mg by mouth 2 (two) times daily.   Triamcinolone  Acetonide (TRIAMCINOLONE  0.1 % CREAM : EUCERIN) CREA Apply 1 application topically 2 (two) times daily.   triamcinolone  cream (KENALOG) 0.1 % Apply 1 Application topically 3 (three) times daily.   trimethoprim -polymyxin b  (POLYTRIM ) ophthalmic solution Place 2 drops into the right eye every 4 (four) hours.    Family History   Family History  Problem Relation Age of Onset   Depression Mother    Pancreatic cancer Maternal Grandfather    Mental illness Maternal Aunt    Liver disease Neg Hx    Colon cancer Neg Hx    Esophageal cancer Neg Hx      Social History   Social History   Tobacco Use   Smoking status: Never   Smokeless tobacco: Never  Vaping Use   Vaping status: Some Days   Substances: Flavoring  Substance Use Topics   Alcohol use: No   Drug use: No    Deborah Cline reports that she has never smoked. She has never used smokeless tobacco. She reports that she does not drink alcohol and does not use drugs.  Vital Signs and Physical Examination  BP (!) 100/58 (BP Location: Left Arm, Patient Position: Sitting, Cuff Size: Normal)   Pulse 84   Ht 5' 11 (1.803 m)   Wt 147 lb 4 oz (66.8 kg)   BMI 20.54 kg/m    General: Well developed, well nourished, no acute distress Head: Normocephalic and atraumatic Eyes: Sclerae anicteric, EOMI Lungs: Clear throughout to auscultation Heart: Regular rate and rhythm; No murmurs, rubs or bruits Abdomen: Soft, non tender and non distended. No masses, hepatosplenomegaly or hernias noted. Normal Bowel sounds Rectal: Perianal skin tag noted at anterior midline. Anterior midline fissures noted. No visible bleeding.  Musculoskeletal: Symmetrical with no gross deformities    Review of Data  The following data was reviewed at the time of this encounter:  Laboratory Studies  Latest Ref Rng & Units 02/04/2024    3:57 PM 07/06/2015    4:27 PM  CBC  WBC 4.0 - 10.5 K/uL 7.1  8.6   Hemoglobin 12.0 - 15.0 g/dL 86.7  86.7   Hematocrit 36.0 - 46.0 % 40.8  39.8   Platelets 150.0 - 400.0 K/uL 257.0  250     No results found for: LIPASE    Latest Ref Rng & Units 02/04/2024    3:57 PM 07/06/2015    4:27 PM  CMP  Glucose 70 - 99 mg/dL 77  94   BUN 6 - 23 mg/dL 8  8   Creatinine 9.59 - 1.20 mg/dL 9.35  9.23   Sodium 864 - 145 mEq/L 136  139   Potassium 3.5 - 5.1 mEq/L 4.1  3.5   Chloride 96 - 112 mEq/L 102  105   CO2 19 - 32 mEq/L 28  24   Calcium 8.4 - 10.5 mg/dL 9.6  9.8   Total Protein 6.0 - 8.3 g/dL 8.5  9.1   Total Bilirubin 0.2 - 1.2 mg/dL 0.5  1.1   Alkaline Phos 39 - 117 U/L 41  69   AST 0 - 37 U/L 12  22   ALT 0 - 35 U/L 8  13      Imaging Studies  None  GI Procedures and Studies  None  Clinical Impression  It is my clinical impression that Deborah Cline is a 23 y.o. female with;  Anal  fissures Perianal skin tag Constipation  Left lower quadrant abdominal pain  Deborah Cline presents for efollow-up of anorectal discomfort with concern for anal fissure/hemorrhoids as well as constipation.  She reports a history of chronic anorectal discomfort over the last year that comes and goes.  Reports a history of constipation with intermittent straining.  Describes having a swelling in the perianal region.  On physical examination at last visit there evidence of 2 anal fissures as well as what appears to be an anterior skin tag.  Laboratory studies were performed to exclude Crohn's disease-blood tests were normal with a borderline fecal calprotectin of 106.  Deborah Cline was prescribed topical treatment with diltiazem /lidocaine for her anal fissure and hydrocortisone  for skin tag.  Also advised to maintain adequate hydration as well as the use of MiraLAX/stool softeners for constipation.  At today's visit, she reports that her symptoms of constipation are improved, however, she is continuing to experience anorectal discomfort.  Stools are not described as being hard but she continues to endorse a tearing sensation in the perineal tissue between her rectum and vagina.  She does have a persistent anal fissure in this region.  I recommended continuing conservative treatment and checking a repeat fecal calprotectin.  If her fecal calprotectin continues to increase would recommend endoscopic evaluation with colonoscopy for potential Crohn's disease.  If her fecal calprotectin is normal can consider referral to colorectal surgery for evaluation of Botox injection or sphincterotomy if deemed appropriate.  She was previously advised by OB/GYN to have a follow-up transvaginal ultrasound regarding her left lower quadrant abdominal pain.  We have recommended that she proceed with this.  If pain worsens can also consider a CT scan of the abdomen and pelvis.  Plan  Continue topical diltiazem  2% + lidocaine 5%  compound cream for 6-8 weeks. Continue opical hydrocortisone  cream 2.5% to be applied to perianal skin tag as needed when she experiences discomfort/swelling. Repeat fecal calprotectin.  If abnormal, would consider cross-sectional imaging and possibly colonoscopy.  Recommend taking low dose of Miralax daily or every other day to avoid hard stools and straining. Could also take a stool softener. Increase water intake. Recommend patient have follow up transvaginal US  to evaluate intermittent LLQ pain. If this persists or worsens we could also order a CT A/P. She has not had pain in a few months.   Planned Follow Up Follow-up 3 months with MD or APP.  The patient or caregiver verbalized understanding of the material covered, with no barriers to understanding. All questions were answered. Patient or caregiver is agreeable with the plan outlined above.    It was a pleasure to see Deborah Cline.  If you have any questions or concerns regarding this evaluation, do not hesitate to contact me.  Inocente Hausen, MD [facility]   I spent total of 30  minutes in both face-to-face (20 minutes interview/physical exam) and non-face-to-face (10 minutes chart review, care coordination, documentation) activities, excluding procedures performed, for the visit on the date of this encounter.

## 2024-04-17 ENCOUNTER — Encounter: Payer: Self-pay | Admitting: Pediatrics

## 2024-04-17 ENCOUNTER — Other Ambulatory Visit

## 2024-04-17 ENCOUNTER — Ambulatory Visit (INDEPENDENT_AMBULATORY_CARE_PROVIDER_SITE_OTHER): Admitting: Pediatrics

## 2024-04-17 VITALS — BP 100/58 | HR 84 | Ht 71.0 in | Wt 147.2 lb

## 2024-04-17 DIAGNOSIS — R1032 Left lower quadrant pain: Secondary | ICD-10-CM

## 2024-04-17 DIAGNOSIS — K5900 Constipation, unspecified: Secondary | ICD-10-CM | POA: Diagnosis not present

## 2024-04-17 DIAGNOSIS — R195 Other fecal abnormalities: Secondary | ICD-10-CM

## 2024-04-17 DIAGNOSIS — K602 Anal fissure, unspecified: Secondary | ICD-10-CM

## 2024-04-17 DIAGNOSIS — K644 Residual hemorrhoidal skin tags: Secondary | ICD-10-CM | POA: Diagnosis not present

## 2024-04-17 NOTE — Patient Instructions (Signed)
 Your provider has requested that you go to the basement level for lab work before leaving today. Press B on the elevator. The lab is located at the first door on the left as you exit the elevator.  Due to recent changes in healthcare laws, you may see the results of your imaging and laboratory studies on MyChart before your provider has had a chance to review them.  We understand that in some cases there may be results that are confusing or concerning to you. Not all laboratory results come back in the same time frame and the provider may be waiting for multiple results in order to interpret others.  Please give us  48 hours in order for your provider to thoroughly review all the results before contacting the office for clarification of your results.    Follow up in 3 months.  Thank you for entrusting me with your care and for choosing [facility], Dr. Inocente Hausen  _______________________________________________________  If your blood pressure at your visit was 140/90 or greater, please contact your primary care physician to follow up on this.  _______________________________________________________  If you are age 42 or older, your body mass index should be between 23-30. Your Body mass index is 20.54 kg/m. If this is out of the aforementioned range listed, please consider follow up with your Primary Care Provider.  If you are age 60 or younger, your body mass index should be between 19-25. Your Body mass index is 20.54 kg/m. If this is out of the aformentioned range listed, please consider follow up with your Primary Care Provider.   ________________________________________________________  The [facility] providers would like to encourage you to use MYCHART to communicate with providers for non-urgent requests or questions.  Due to long hold times on the telephone, sending your provider a message by [facility] may be a faster and more efficient way to get a response.  Please allow 48  business hours for a response.  Please remember that this is for non-urgent requests.  _______________________________________________________

## 2024-04-28 ENCOUNTER — Encounter: Payer: Self-pay | Admitting: Internal Medicine

## 2024-04-30 ENCOUNTER — Other Ambulatory Visit

## 2024-04-30 DIAGNOSIS — R195 Other fecal abnormalities: Secondary | ICD-10-CM

## 2024-04-30 DIAGNOSIS — K602 Anal fissure, unspecified: Secondary | ICD-10-CM

## 2024-05-02 LAB — CALPROTECTIN, FECAL: Calprotectin, Fecal: 32 ug/g (ref 0–120)

## 2024-05-04 ENCOUNTER — Ambulatory Visit: Payer: Self-pay | Admitting: Pediatrics

## 2024-06-09 ENCOUNTER — Telehealth: Payer: Self-pay | Admitting: Internal Medicine

## 2024-06-09 MED ORDER — NIRMATRELVIR&RITONAVIR 300/100 20 X 150 MG & 10 X 100MG PO TBPK: 3.0000 | ORAL_TABLET | Freq: Two times a day (BID) | ORAL | 0 refills | Status: DC

## 2024-06-09 MED ORDER — NIRMATRELVIR&RITONAVIR 300/100 20 X 150 MG & 10 X 100MG PO TBPK: 3.0000 | ORAL_TABLET | Freq: Two times a day (BID) | ORAL | 0 refills | Status: AC

## 2024-06-09 NOTE — Telephone Encounter (Signed)
 She and mother both acutely ill, Covid positive this weekend, respiratory symptoms Paxlovid  to [facility] [address]

## 2024-06-09 NOTE — Telephone Encounter (Signed)
 Covid positive. Sending paxlovid 

## 2024-06-19 ENCOUNTER — Encounter: Payer: Self-pay | Admitting: Pediatrics

## 2024-06-25 ENCOUNTER — Ambulatory Visit (INDEPENDENT_AMBULATORY_CARE_PROVIDER_SITE_OTHER): Admitting: Gastroenterology

## 2024-06-25 ENCOUNTER — Encounter: Payer: Self-pay | Admitting: Gastroenterology

## 2024-06-25 VITALS — BP 90/60 | HR 88 | Ht 70.5 in | Wt 147.2 lb

## 2024-06-25 DIAGNOSIS — K602 Anal fissure, unspecified: Secondary | ICD-10-CM

## 2024-06-25 DIAGNOSIS — R109 Unspecified abdominal pain: Secondary | ICD-10-CM

## 2024-06-25 DIAGNOSIS — K5900 Constipation, unspecified: Secondary | ICD-10-CM

## 2024-06-25 NOTE — Patient Instructions (Addendum)
 Donut pillow Recommend keeping stools on softer side No straining or pushing Sitz baths as tolerated Keep area clean and dry Referral to Dr. Bernarda Ned at [facility] surgery  _______________________________________________________  If your blood pressure at your visit was 140/90 or greater, please contact your primary care physician to follow up on this.  _______________________________________________________  If you are age 24 or older, your body mass index should be between 23-30. Your Body mass index is 20.83 kg/m. If this is out of the aforementioned range listed, please consider follow up with your Primary Care Provider.  If you are age 52 or younger, your body mass index should be between 19-25. Your Body mass index is 20.83 kg/m. If this is out of the aformentioned range listed, please consider follow up with your Primary Care Provider.   ________________________________________________________  The [facility] providers would like to encourage you to use MYCHART to communicate with providers for non-urgent requests or questions.  Due to long hold times on the telephone, sending your provider a message by [facility] may be a faster and more efficient way to get a response.  Please allow 48 business hours for a response.  Please remember that this is for non-urgent requests.  _______________________________________________________  [facility] is using a team-based approach to care.  Your team is made up of your doctor and two to three APPS. Our APPS (Nurse Practitioners and Physician Assistants) work with your physician to ensure care continuity for you. They are fully qualified to address your health concerns and develop a treatment plan. They communicate directly with your gastroenterologist to care for you. Seeing the Advanced Practice Practitioners on your physician's team can help you by facilitating care more promptly, often allowing for earlier appointments,  access to diagnostic testing, procedures, and other specialty referrals.   Thank you for trusting me with your gastrointestinal care. Deanna May, FNP-C

## 2024-06-25 NOTE — Progress Notes (Signed)
 Chief Complaint: follow-up anal fissure Primary GI Doctor:Dr. Suzann  Referring Provider Gretta Elsie BIRCH, MD [address] [facility] [place],  [place] [address]   Primary Care Provider Gretta Elsie BIRCH, MD   Patient Profile: Deborah Cline is a 23 y.o. female with a past medical history noteworthy for anxiety, depression and asthma who returns to the [facility] office for follow-up of the problem(s) noted below.   Problem List: Anal fissures Perianal skin tag Constipation Left lower quadrant abdominal pain   History of Present Illness   Ms. Deborah Cline is a 23 y.o. female with a history of anxiety, depression, and asthma, who returns to the office for follow-up of anal fissure, perianal skin tag, constipation and abdominal pain   History of Present Illness   Patient last seen in GI office on 04/17/2024 by Dr. Suzann.   Current GI Meds  Diltiazem  2% + lidocaine 5% compound cream Hydrocortisone  cream 2.5%    Interval History   Patient presents for follow-up.Patient completed 6 weeks of using the Diltiazem  2% + lidocaine 5% compound cream. Patient reports she has continued with rectal pain and feels the fissure has gotten bigger. She enquires about being referred to colon rectal surgeon.  She does warm showers twice daily which helps. She denies rectal bleeding. She is vegetarian and eats a lot of fiber  She reports drinking more water has helped which regulating bowels.  She is having 1-2 stools per day.   She reports intermittent LLQ abdominal pain that has been ongoing for 4+ years. She informs me she is pending appointment with OB/GYN/   Lab work: 02/11/24 fecal calprotectin 106 04/30/24 fecal calprotectin 32  No prior endoscopic procedures.   No prior abdominal CT/MRI.    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 75 mL 1   albuterol  (VENTOLIN  HFA) 108 (90 Base) MCG/ACT inhaler Inhale 2 puffs into the lungs every 6 (six) hours as needed for wheezing or shortness of breath. 18 g 12   Amphetamine  Sulfate (EVEKEO ) 10 MG TABS Take 20 mg by mouth 2 (two) times daily. 120 tablet 0   busPIRone  (BUSPAR ) 10 MG tablet Take 1 tablet (10 mg total) by mouth daily. 90 tablet 1   diltiazem  2 % GEL Apply 1 Application topically 3 (three) times daily. 30 g 1   etonogestrel-ethinyl estradiol (NUVARING ) 0.12-0.015 MG/24HR vaginal ring USE ONE RING EVERY THREE WEEKS, AND THEN REMOVE FOR ONE WEEK 3 each 3   fexofenadine -pseudoephedrine (ALLEGRA-D) 60-120 MG 12 hr tablet Take 1 tablet by mouth 2 (two) times daily. 30 tablet 0   FLUoxetine  (PROZAC ) 20 MG capsule Take 1 capsule 2 times daily 180 capsule 1   hydrocortisone  (  ANUSOL -HC) 2.5 % rectal cream Place 1 Application rectally 2 (two) times daily. 30 g 1   ipratropium-albuterol  (DUONEB) 0.5-2.5 (3) MG/3ML SOLN Take 3 mLs by nebulization every 6 (six) hours as needed. 75 mL 12   ondansetron  (ZOFRAN  ODT) 4 MG disintegrating tablet Take 1 tablet (4 mg total) by mouth every 8 (eight) hours as needed for nausea or vomiting. 20 tablet 0   traZODone  (DESYREL ) 100 MG tablet Take 200 mg by mouth 2 (two) times daily.     Triamcinolone  Acetonide (TRIAMCINOLONE  0.1 % CREAM : EUCERIN) CREA Apply 1 application topically 2  (two) times daily. 1 each 1   trimethoprim -polymyxin b  (POLYTRIM ) ophthalmic solution Place 2 drops into the right eye every 4 (four) hours. 10 mL 0   No current facility-administered medications for this visit.    Allergies as of 06/25/2024 - Review Complete 06/25/2024  Allergen Reaction Noted   Cantaloupe (diagnostic)  10/06/2015   Fish allergy  07/06/2015   Peanut butter flavoring agent (non-screening)  07/06/2015    Family History  Problem Relation Age of Onset   Depression Mother    Pancreatic cancer Maternal Grandfather    Mental illness Maternal Aunt    Liver disease Neg Hx    Colon cancer Neg Hx    Esophageal cancer Neg Hx     Review of Systems:    Constitutional: No weight loss, fever, chills, weakness or fatigue HEENT: Eyes: No change in vision               Ears, Nose, Throat:  No change in hearing or congestion Skin: No rash or itching Cardiovascular: No chest pain, chest pressure or palpitations   Respiratory: No SOB or cough Gastrointestinal: See HPI and otherwise negative Genitourinary: No dysuria or change in urinary frequency Neurological: No headache, dizziness or syncope Musculoskeletal: No new muscle or joint pain Hematologic: No bleeding or bruising Psychiatric: No history of depression or anxiety    Physical Exam:  Vital signs: BP 90/60 (BP Location: Left Arm, Patient Position: Sitting, Cuff Size: Normal)   Pulse 88   Ht 5' 10.5 (1.791 m) Comment: height measured without shoes  Wt 147 lb 4 oz (66.8 kg)   LMP 05/26/2024   BMI 20.83 kg/m   Constitutional:   Pleasant female appears to be in NAD, Well developed, Well nourished, alert and cooperative Throat: Oral cavity and pharynx without inflammation, swelling or lesion.  Respiratory: Respirations even and unlabored. Lungs clear to auscultation bilaterally.   No wheezes, crackles, or rhonchi.  Cardiovascular: Normal S1, S2. Regular rate and rhythm. No peripheral edema, cyanosis or pallor.   Gastrointestinal:  Soft, nondistended, nontender. No rebound or guarding. Normal bowel sounds. No appreciable masses or hepatomegaly. Rectal:  Not performed.  Msk:  Symmetrical without gross deformities. Without edema, no deformity or joint abnormality.  Neurologic:  Alert and  oriented x4;  grossly normal neurologically.  Skin:   Dry and intact without significant lesions or rashes.  RELEVANT LABS AND IMAGING: CBC    Latest Ref Rng & Units 02/04/2024    3:57 PM 07/06/2015    4:27 PM  CBC  WBC 4.0 - 10.5 K/uL 7.1  8.6   Hemoglobin 12.0 - 15.0 g/dL 86.7  86.7   Hematocrit 36.0 - 46.0 % 40.8  39.8   Platelets 150.0 - 400.0 K/uL 257.0  250      CMP     Latest Ref Rng & Units 02/04/2024    3:57 PM 07/06/2015  4:27 PM  CMP  Glucose 70 - 99 mg/dL 77  94   BUN 6 - 23 mg/dL 8  8   Creatinine 9.59 - 1.20 mg/dL 9.35  9.23   Sodium 864 - 145 mEq/L 136  139   Potassium 3.5 - 5.1 mEq/L 4.1  3.5   Chloride 96 - 112 mEq/L 102  105   CO2 19 - 32 mEq/L 28  24   Calcium 8.4 - 10.5 mg/dL 9.6  9.8   Total Protein 6.0 - 8.3 g/dL 8.5  9.1   Total Bilirubin 0.2 - 1.2 mg/dL 0.5  1.1   Alkaline Phos 39 - 117 U/L 41  69   AST 0 - 37 U/L 12  22   ALT 0 - 35 U/L 8  13       Assessment: Encounter Diagnoses  Name Primary?   Anal fissure Yes   Intermittent abdominal pain    Constipation, unspecified constipation type     23 year old female patient that presents for follow-up of anal fissure as well as constipation. The anal fissure has failed to improve despite using the  topical diltiazem  2% + lidocaine 5% compound cream for [redacted] weeks along with other supportive measures. She would like to be referred to colon rectal surgeon. Will send referral. Repeat fecal calprotectin was normal. Her constipation has improved with increasing water intake and has 1-2 bowel movements daily.    She was previously advised by OB/GYN to have a follow-up transvaginal ultrasound regarding her left lower quadrant abdominal  pain. She informs me today she is making an appointment for this.   Plan: -referral to [facility] Dr. Bernarda Ned  -Recommend taking low dose of Miralax daily or every other day to avoid hard stools and straining. Could also take a stool softener. Increase water intake.    Thank you for the courtesy of this consult. Please call me with any questions or concerns.   Harvy Riera, FNP-C [facility] 06/25/2024, 8:41 PM  Cc: Gretta Elsie BIRCH, MD  I have reviewed the clinic note as outlined by Cathryne Beal, NP and agree with the assessment, plan and medical decision making.  Monico returns to the office today for chronic anal fissure and constipation.  She has been using topical agents with diltiazem  and lidocaine as well as constipation management.  Despite this she is continuing to experience anal fissure which is now larger.  We considered the possibility of IBD given age and anal fissure-initial fecal calprotectin was mildly elevated but repeat was normal.  No other features of IBD at this time.  Agree with referral to colorectal surgery for further fissure management-Botox or consideration of surgical intervention.  If new symptoms evolve that are worrisome for Crohn's disease we will certainly reevaluate.  Agree with transvaginal ultrasound for left lower quadrant abdominal pain.  Inocente Hausen, MD

## 2024-07-03 ENCOUNTER — Other Ambulatory Visit: Payer: Self-pay | Admitting: *Deleted

## 2024-07-03 ENCOUNTER — Telehealth: Payer: Self-pay | Admitting: Gastroenterology

## 2024-07-03 ENCOUNTER — Encounter: Payer: Self-pay | Admitting: Pediatrics

## 2024-07-03 DIAGNOSIS — K644 Residual hemorrhoidal skin tags: Secondary | ICD-10-CM

## 2024-07-03 DIAGNOSIS — K602 Anal fissure, unspecified: Secondary | ICD-10-CM

## 2024-07-03 NOTE — Telephone Encounter (Signed)
 Inbound call from patient stating that she called [facility] and they did not have her referral. Requesting it be resent. Please advise.

## 2024-07-03 NOTE — Progress Notes (Signed)
 am

## 2024-07-03 NOTE — Telephone Encounter (Signed)
 Referral faxed

## 2024-07-12 ENCOUNTER — Other Ambulatory Visit: Payer: Self-pay

## 2024-07-12 ENCOUNTER — Emergency Department (HOSPITAL_BASED_OUTPATIENT_CLINIC_OR_DEPARTMENT_OTHER)
Admission: EM | Admit: 2024-07-12 | Discharge: 2024-07-12 | Disposition: A | Discharge status: Home / Self Care | Attending: Emergency Medicine | Admitting: Emergency Medicine

## 2024-07-12 ENCOUNTER — Encounter (HOSPITAL_BASED_OUTPATIENT_CLINIC_OR_DEPARTMENT_OTHER): Payer: Self-pay

## 2024-07-12 DIAGNOSIS — J45909 Unspecified asthma, uncomplicated: Secondary | ICD-10-CM | POA: Insufficient documentation

## 2024-07-12 DIAGNOSIS — Z91010 Allergy to peanuts: Secondary | ICD-10-CM | POA: Insufficient documentation

## 2024-07-12 DIAGNOSIS — Z4800 Encounter for change or removal of nonsurgical wound dressing: Secondary | ICD-10-CM | POA: Insufficient documentation

## 2024-07-12 DIAGNOSIS — K644 Residual hemorrhoidal skin tags: Secondary | ICD-10-CM | POA: Diagnosis not present

## 2024-07-12 DIAGNOSIS — Z5189 Encounter for other specified aftercare: Secondary | ICD-10-CM

## 2024-07-12 LAB — WET PREP, GENITAL
Clue Cells Wet Prep HPF POC: NONE SEEN
Sperm: NONE SEEN
Trich, Wet Prep: NONE SEEN
WBC, Wet Prep HPF POC: <10 (ref <10)
Yeast Wet Prep HPF POC: NONE SEEN

## 2024-07-12 MED ORDER — HYDROCODONE-ACETAMINOPHEN 5-325 MG PO TABS: 1.0000 | ORAL_TABLET | ORAL | 0 refills | Status: AC | PRN

## 2024-07-12 MED ORDER — OXYCODONE-ACETAMINOPHEN 5-325 MG PO TABS
1.0000 | ORAL_TABLET | Freq: Once | ORAL | Status: AC
  Administered 2024-07-12: 1 via ORAL
  Filled 2024-07-12: qty 1

## 2024-07-12 MED ORDER — ONDANSETRON HCL 4 MG PO TABS: 4.0000 mg | ORAL_TABLET | Freq: Four times a day (QID) | ORAL | 0 refills | Status: AC

## 2024-07-12 NOTE — ED Provider Notes (Signed)
 [place] [facility] Provider Note   CSN: [id number] Arrival date & time: 07/12/24  1100     Patient presents with: Wound Check   Deborah Cline is a 23 y.o. female.   23 year old female with a past medical history of asthma, depression presents to the ED with a chief complaint of perineum tearing x 2 years.  Patient reports she has had multiple visits for tears, fissures, hemorrhoids with gastroenterology.  She did receive testing for Crohn's, and the other irregular bowel diseases with all negative.  She reports she has had a lot of pain to the lower part of her hernia area.  She reports multiple episodes at home such as sit baths, water washcloths, trying to allow the area to relax.  She reports the pain is excruciating especially at night, she is unable to sleep.  She does have an appointment with general surgery in order to see them but this is not until sometime in October.  She does not have any signs of constipation, has not had any abdominal pain, no fever or other complaints.  The history is provided by the patient and a parent.  Wound Check Pertinent negatives include no chest pain, no abdominal pain and no shortness of breath.       Prior to Admission medications   Medication Sig Start Date End Date Taking? Authorizing Provider  HYDROcodone-acetaminophen  (NORCO/VICODIN) 5-325 MG tablet Take 1 tablet by mouth every 4 (four) hours as needed for up to 3 days. 07/12/24 07/15/24 Yes Dontrel Smethers, PA-C  albuterol  (PROVENTIL ) (2.5 MG/3ML) 0.083% nebulizer solution Take 3 mLs (2.5 mg total) by nebulization every 6 (six) hours as needed for wheezing or shortness of breath. 02/16/20   Paretta-Leahey, Stephane HERO, NP  albuterol  (VENTOLIN  HFA) 108 (90 Base) MCG/ACT inhaler Inhale 2 puffs into the lungs every 6 (six) hours as needed for wheezing or shortness of breath. 03/08/21   Neysa Rama D, MD  Amphetamine  Sulfate (EVEKEO ) 10 MG TABS Take 20 mg by  mouth 2 (two) times daily. 01/22/22   Paretta-Leahey, Stephane HERO, NP  busPIRone  (BUSPAR ) 10 MG tablet Take 1 tablet (10 mg total) by mouth daily. 01/22/22   Paretta-Leahey, Stephane HERO, NP  diltiazem  2 % GEL Apply 1 Application topically 3 (three) times daily. 02/04/24   Suzann Inocente HERO, MD  etonogestrel-ethinyl estradiol (NUVARING ) 0.12-0.015 MG/24HR vaginal ring USE ONE RING EVERY THREE WEEKS, AND THEN REMOVE FOR ONE WEEK 06/22/21   Paretta-Leahey, Stephane HERO, NP  fexofenadine -pseudoephedrine (ALLEGRA-D) 60-120 MG 12 hr tablet Take 1 tablet by mouth 2 (two) times daily. 08/01/16   Zamora, Erin R, NP  FLUoxetine  (PROZAC ) 20 MG capsule Take 1 capsule 2 times daily 07/13/21   Paretta-Leahey, Stephane HERO, NP  hydrocortisone  (ANUSOL -HC) 2.5 % rectal cream Place 1 Application rectally 2 (two) times daily. 02/04/24   Suzann Inocente HERO, MD  ipratropium-albuterol  (DUONEB) 0.5-2.5 (3) MG/3ML SOLN Take 3 mLs by nebulization every 6 (six) hours as needed. 03/07/21   Neysa Rama D, MD  ondansetron  (ZOFRAN  ODT) 4 MG disintegrating tablet Take 1 tablet (4 mg total) by mouth every 8 (eight) hours as needed for nausea or vomiting. 05/02/22   Paretta-Leahey, Stephane HERO, NP  traZODone  (DESYREL ) 100 MG tablet Take 200 mg by mouth 2 (two) times daily.    [provider]  Triamcinolone  Acetonide (TRIAMCINOLONE  0.1 % CREAM : EUCERIN) CREA Apply 1 application topically 2 (two) times daily. 07/13/21   Paretta-Leahey, Stephane HERO, NP  trimethoprim -polymyxin b  (POLYTRIM ) ophthalmic solution Place 2 drops into the right eye every 4 (four) hours. 12/07/21   Fleming, Zelda W, NP    Allergies: Cantaloupe (diagnostic), Fish allergy, and Peanut butter flavoring agent (non-screening)    Review of Systems  Constitutional:  Negative for chills and fever.  Respiratory:  Negative for shortness of breath.   Cardiovascular:  Negative for chest pain.  Gastrointestinal:  Negative for abdominal pain, diarrhea and vomiting.  Skin:  Positive for wound.  All  other systems reviewed and are negative.   Updated Vital Signs BP (!) 144/81 (BP Location: Right Arm)   Pulse 93   Temp 98.3 F (36.8 C) (Oral)   Resp 17   LMP 07/02/2024 (Approximate)   SpO2 100%   Physical Exam Vitals and nursing note reviewed. Exam conducted with a chaperone present.  Constitutional:      Appearance: Normal appearance.  HENT:     Head: Normocephalic and atraumatic.     Mouth/Throat:     Mouth: Mucous membranes are moist.  Cardiovascular:     Rate and Rhythm: Normal rate.  Pulmonary:     Effort: Pulmonary effort is normal.  Abdominal:     General: Abdomen is flat.  Genitourinary:    Rectum: External hemorrhoid present.      Comments: Chaperoned by Grayce PEAK.  Skin appears dried, there is slight cracking on the size to account for skin tear.  Whole area does appear dry. Musculoskeletal:     Cervical back: Normal range of motion and neck supple.  Skin:    General: Skin is warm and dry.  Neurological:     Mental Status: She is alert and oriented to person, place, and time.     (all labs ordered are listed, but only abnormal results are displayed) Labs Reviewed  WET PREP, GENITAL    EKG: None  Radiology: No results found.   Procedures   Medications Ordered in the ED  oxyCODONE-acetaminophen  (PERCOCET/ROXICET) 5-325 MG per tablet 1 tablet (1 tablet Oral Given 07/12/24 1152)                                    Medical Decision Making Amount and/or Complexity of Data Reviewed Labs: ordered.  Risk Prescription drug management.    Present to the ED with a chief complaint of vaginal tears that have been ongoing for 2 years, was thoroughly evaluated by gastroenterology with all negative workups.  Reports that the pain has been unbearable lately.  She supposed to see general surgery but this is not for several weeks.  She reports that the pain is worsening.  She is continues to do warm sit baths, water rags to the area without any improvement  in symptoms.  She is a vegetarian therefore she denies any constipation or straining.  She is not having any abdominal pain nausea or vomiting.  She is accompanied by mother and boyfriend.  GU exam was done by me with chaperone Robin RN at the bedside.  Multiple visible small vaginal tears noted.  The skin does appear dry and cracked.  Vitals are within normal limits here.  We discussed appropriate moisture of the area, will need to follow-up with general surgery.  Did discuss given her medications for pain control.  They are agreeable to following up with specialist.  Hemodynamically stable for discharge.  Portions of this note were generated with Scientist, clinical (histocompatibility and immunogenetics). Dictation errors  may occur despite best attempts at proofreading.   Final diagnoses:  Visit for wound check    ED Discharge Orders          Ordered    HYDROcodone-acetaminophen  (NORCO/VICODIN) 5-325 MG tablet  Every 4 hours PRN        07/12/24 1248               Gokul Waybright, PA-C 07/12/24 1251    Cottie Donnice PARAS, MD 07/12/24 1350

## 2024-07-12 NOTE — Discharge Instructions (Addendum)
 Your wet prep was negative for any infection or yeast.  You were given a short course of pain control to help with your symptoms.  Please follow-up with your general surgery team.

## 2024-07-12 NOTE — ED Notes (Signed)
 Pt eating crackers and apple sauce and drinking soda

## 2024-07-12 NOTE — ED Triage Notes (Signed)
 Reports swollen perineal tear for 1.5 year. States swelling since Wednesday. Spotting starting today.

## 2024-09-01 ENCOUNTER — Telehealth: Payer: Self-pay

## 2024-09-01 NOTE — Telephone Encounter (Signed)
-----   Message from Cathryne PARAS May sent at 08/31/2024  4:15 PM EST ----- Pod B- Can we get her scheduled for colonoscopy with Dr. Suzann first available r/o perianal crohns , anal fissure, rectal pain, altered bowel habits, abdominal pain.  Deanna, NP

## 2024-09-01 NOTE — Telephone Encounter (Signed)
 Patient returned call. Patient has been scheduled for telephone PV on Tuesday, 09/29/24 at 12:30 pm. [facility] Colonoscopy scheduled for Wednesday, 10/14/24 arriving at 12:30 pm.

## 2024-09-01 NOTE — Telephone Encounter (Signed)
Lm on vm for patient to return call.   My Chart message sent to patient.

## 2024-09-29 ENCOUNTER — Ambulatory Visit

## 2024-09-29 VITALS — Ht 71.0 in | Wt 145.0 lb

## 2024-09-29 DIAGNOSIS — K602 Anal fissure, unspecified: Secondary | ICD-10-CM

## 2024-09-29 DIAGNOSIS — R109 Unspecified abdominal pain: Secondary | ICD-10-CM

## 2024-09-29 MED ORDER — NA SULFATE-K SULFATE-MG SULF 17.5-3.13-1.6 GM/177ML PO SOLN: 1.0000 | Freq: Once | ORAL | 0 refills | Status: AC

## 2024-09-29 NOTE — Progress Notes (Signed)

## 2024-10-13 NOTE — Progress Notes (Unsigned)
 [facility] History and Physical   Primary Care Physician:  Sim Emery CROME, MD   Reason for Procedure:  Anal fissure, perianal skin tag, constipation, left lower quadrant abdominal pain, concern for Crohn's disease  Plan:    Colonoscopy   The patient was provided an opportunity to ask questions and all were answered. The patient agreed with the plan.   HPI: Deborah Cline is a 24 y.o. female undergoing colonoscopy for investigation of anal fissure, perianal skin tag, constipation and left lower quadrant abdominal pain with concern for Crohn's disease.  Patient initially seen for an anal fissure and skin tag treated with conservative topical therapy without improvement.  Cologuard 02/2024 was elevated at 106 and on recheck normalized to 32 in 04/2024.  Seen by colorectal surgery 08/2024 with concern for perianal Crohn's disease and treated with steroid taper.  Colonoscopy was performed today to evaluate for endoscopic evidence of Crohn's disease.   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 Authorizing Provider  albuterol  (PROVENTIL ) (2.5 MG/3ML) 0.083% nebulizer solution Take 3 mLs (2.5 mg total) by nebulization every 6 (six) hours as needed for wheezing or shortness of breath. 02/16/20   Paretta-Leahey, Stephane HERO, NP  albuterol  (VENTOLIN  HFA) 108 (90 Base) MCG/ACT inhaler Inhale 2 puffs into the lungs every 6 (six) hours as needed for wheezing or shortness of breath. 03/08/21   Neysa Reggy BIRCH, MD  Amphetamine  Sulfate (EVEKEO ) 10 MG TABS Take 20 mg by mouth 2 (two) times daily. Patient not taking:  Reported on 09/29/2024 01/22/22   Paretta-Leahey, Stephane HERO, NP  busPIRone  (BUSPAR ) 10 MG tablet Take 1 tablet (10 mg total) by mouth daily. 01/22/22   Paretta-Leahey, Stephane HERO, NP  diltiazem  2 % GEL Apply 1 Application topically 3 (three) times daily. Patient not taking: Reported on 09/29/2024 02/04/24   Suzann Inocente HERO, MD  etonogestrel-ethinyl estradiol (NUVARING ) 0.12-0.015 MG/24HR vaginal ring USE ONE RING EVERY THREE WEEKS, AND THEN REMOVE FOR ONE WEEK 06/22/21   Paretta-Leahey, Stephane HERO, NP  fexofenadine -pseudoephedrine (ALLEGRA-D) 60-120 MG 12 hr tablet Take 1 tablet by mouth 2 (two) times daily. Patient not taking: Reported on 09/29/2024 08/01/16   Valdemar Rocky SAUNDERS, NP  FLUoxetine  (PROZAC ) 20 MG capsule Take 1 capsule 2 times daily 07/13/21   Paretta-Leahey, Dawn M, NP  hydrocortisone  (ANUSOL -HC) 2.5 % rectal cream Place 1 Application rectally 2 (two) times daily. Patient not taking: Reported on 09/29/2024 02/04/24   Suzann Inocente HERO, MD  ipratropium-albuterol  (DUONEB) 0.5-2.5 (3) MG/3ML SOLN Take 3 mLs by nebulization every 6 (six) hours as needed. Patient not taking: Reported on 09/29/2024 03/07/21   Neysa Reggy D, MD  ondansetron  (ZOFRAN  ODT) 4 MG disintegrating tablet Take 1 tablet (4 mg total) by mouth every 8 (eight) hours as needed for nausea or vomiting. 05/02/22   Paretta-Leahey, Stephane HERO, NP  traZODone  (DESYREL ) 100 MG tablet Take 200 mg by mouth 2 (two) times daily.    [provider]  Triamcinolone  Acetonide (TRIAMCINOLONE  0.1 % CREAM : EUCERIN) CREA Apply 1 application topically  2 (two) times daily. Patient not taking: Reported on 09/29/2024 07/13/21   Paretta-Leahey, Stephane HERO, NP  trimethoprim -polymyxin b  (POLYTRIM ) ophthalmic solution Place 2 drops into the right eye every 4 (four) hours. Patient not taking: Reported on 09/29/2024 12/07/21   Fleming, Zelda W, NP    Current Outpatient Medications  Medication Sig Dispense Refill   albuterol  (VENTOLIN  HFA) 108 (90 Base) MCG/ACT  inhaler Inhale 2 puffs into the lungs every 6 (six) hours as needed for wheezing or shortness of breath. 18 g 12   clotrimazole (LOTRIMIN) 1 % cream SMARTSIG:1 Topical Daily     fluconazole (DIFLUCAN) 150 MG tablet TAKE 1 TABLET BY MOUTH ONCE EVERY 2 DAYS FOR RECURRENT YEAST INFECTION.     albuterol  (PROVENTIL ) (2.5 MG/3ML) 0.083% nebulizer solution Take 3 mLs (2.5 mg total) by nebulization every 6 (six) hours as needed for wheezing or shortness of breath. 75 mL 1   Amphetamine  Sulfate (EVEKEO ) 10 MG TABS Take 20 mg by mouth 2 (two) times daily. (Patient not taking: No sig reported) 120 tablet 0   busPIRone  (BUSPAR ) 10 MG tablet Take 1 tablet (10 mg total) by mouth daily. 90 tablet 1   diltiazem  2 % GEL Apply 1 Application topically 3 (three) times daily. (Patient not taking: No sig reported) 30 g 1   etonogestrel-ethinyl estradiol (NUVARING ) 0.12-0.015 MG/24HR vaginal ring USE ONE RING EVERY THREE WEEKS, AND THEN REMOVE FOR ONE WEEK 3 each 3   fexofenadine -pseudoephedrine (ALLEGRA-D) 60-120 MG 12 hr tablet Take 1 tablet by mouth 2 (two) times daily. (Patient not taking: No sig reported) 30 tablet 0   FLUoxetine  (PROZAC ) 20 MG capsule Take 1 capsule 2 times daily (Patient not taking: Reported on 10/14/2024) 180 capsule 1   hydrocortisone  (ANUSOL -HC) 2.5 % rectal cream Place 1 Application rectally 2 (two) times daily. (Patient not taking: No sig reported) 30 g 1   ipratropium-albuterol  (DUONEB) 0.5-2.5 (3) MG/3ML SOLN Take 3 mLs by nebulization every 6 (six) hours as needed. (Patient not taking: No sig reported) 75 mL 12   ondansetron  (ZOFRAN  ODT) 4 MG disintegrating tablet Take 1 tablet (4 mg total) by mouth every 8 (eight) hours as needed for nausea or vomiting. 20 tablet 0   traZODone  (DESYREL ) 100 MG tablet Take 200 mg by mouth 2 (two) times daily.     Triamcinolone  Acetonide (TRIAMCINOLONE  0.1 % CREAM : EUCERIN) CREA Apply 1 application topically 2 (two) times daily. (Patient not taking: No sig  reported) 1 each 1   triamcinolone  cream (KENALOG) 0.1 % Apply topically 2 (two) times daily. (Patient not taking: Reported on 10/14/2024)     trimethoprim -polymyxin b  (POLYTRIM ) ophthalmic solution Place 2 drops into the right eye every 4 (four) hours. (Patient not taking: No sig reported) 10 mL 0   Current Facility-Administered Medications  Medication Dose Route Frequency Provider Last Rate Last Admin   0.9 %  sodium chloride  infusion  500 mL Intravenous Once Shuna Tabor M, MD        Allergies as of 10/14/2024 - Review Complete 10/14/2024  Allergen Reaction Noted   Cantaloupe (diagnostic) Anaphylaxis 10/06/2015   Fish allergy Anaphylaxis 07/06/2015   Peanut butter flavoring agent (non-screening) Other (See Comments) 07/06/2015    Family History  Problem Relation Age of Onset   Depression Mother    Mental illness Maternal Aunt    Pancreatic cancer Maternal Grandfather    Liver disease Neg Hx    Colon cancer Neg Hx    Esophageal cancer Neg Hx  Colon polyps Neg Hx    Rectal cancer Neg Hx    Stomach cancer Neg Hx     Social History   Socioeconomic History   Marital status: Single    Spouse name: Not on file   Number of children: 0   Years of education: Not on file   Highest education level: Some college, no degree  Occupational History   Occupation: Waiter  Tobacco Use   Smoking status: Never   Smokeless tobacco: Never  Vaping Use   Vaping status: Former   Substances: Flavoring  Substance and Sexual Activity   Alcohol use: No   Drug use: No   Sexual activity: Yes    Partners: Male    Birth control/protection: Other-see comments    Comment: Nuvaring   Other Topics Concern   Not on file  Social History Narrative   Caley is a 11th grade student.   She attends [employer].   She lives with her mom. She has an older sister.   She enjoys sleeping, eating, and video games.   Social Drivers of Health   Tobacco Use: Low Risk (10/14/2024)    Patient History    Smoking Tobacco Use: Never    Smokeless Tobacco Use: Never    Passive Exposure: Not on file  Financial Resource Strain: Not on file  Food Insecurity: Not on file  Transportation Needs: Not on file  Physical Activity: Not on file  Stress: Not on file  Social Connections: Not on file  Intimate Partner Violence: Not on file  Depression (EYV7-0): Not on file  Alcohol Screen: Not on file  Housing: Not on file  Utilities: Not on file  Health Literacy: Not on file    Review of Systems:  All other review of systems negative except as mentioned in the HPI.  Physical Exam: Vital signs BP 109/60   Pulse 82   Temp (!) 97.2 F (36.2 C) (Skin)   Ht 5' 11 (1.803 m)   Wt 145 lb (65.8 kg)   SpO2 100%   BMI 20.22 kg/m   General:   Alert,  Well-developed, well-nourished, pleasant and cooperative in NAD Airway:  Mallampati 2 Lungs:  Clear throughout to auscultation.   Heart:  Regular rate and rhythm; no murmurs, clicks, rubs,  or gallops. Abdomen:  Soft, nontender and nondistended. Normal bowel sounds.   Neuro/Psych:  Normal mood and affect. A and O x 3  Inocente Hausen, MD [facility]

## 2024-10-14 ENCOUNTER — Encounter: Payer: Self-pay | Admitting: Pediatrics

## 2024-10-14 ENCOUNTER — Ambulatory Visit (AMBULATORY_SURGERY_CENTER): Admitting: Pediatrics

## 2024-10-14 VITALS — BP 127/62 | HR 83 | Temp 97.2°F | Resp 19 | Ht 71.0 in | Wt 145.0 lb

## 2024-10-14 DIAGNOSIS — R109 Unspecified abdominal pain: Secondary | ICD-10-CM

## 2024-10-14 DIAGNOSIS — K602 Anal fissure, unspecified: Secondary | ICD-10-CM

## 2024-10-14 DIAGNOSIS — K6389 Other specified diseases of intestine: Secondary | ICD-10-CM | POA: Diagnosis not present

## 2024-10-14 DIAGNOSIS — K644 Residual hemorrhoidal skin tags: Secondary | ICD-10-CM

## 2024-10-14 DIAGNOSIS — R1032 Left lower quadrant pain: Secondary | ICD-10-CM

## 2024-10-14 DIAGNOSIS — K639 Disease of intestine, unspecified: Secondary | ICD-10-CM | POA: Diagnosis not present

## 2024-10-14 MED ORDER — SODIUM CHLORIDE 0.9 % IV SOLN: 500.0000 mL | Freq: Once | INTRAVENOUS | Status: DC

## 2024-10-14 NOTE — Patient Instructions (Signed)
 Resume previous diet and medications. Awaiting pathology results. Consider MRE with perianal protocol +/- IBD serologies.  YOU HAD AN ENDOSCOPIC PROCEDURE TODAY AT THE [facility]:   Refer to the procedure report that was given to you for any specific questions about what was found during the examination.  If the procedure report does not answer your questions, please call your gastroenterologist to clarify.  If you requested that your care partner not be given the details of your procedure findings, then the procedure report has been included in a sealed envelope for you to review at your convenience later.  YOU SHOULD EXPECT: Some feelings of bloating in the abdomen. Passage of more gas than usual.  Walking can help get rid of the air that was put into your GI tract during the procedure and reduce the bloating. If you had a lower endoscopy (such as a colonoscopy or flexible sigmoidoscopy) you may notice spotting of blood in your stool or on the toilet paper. If you underwent a bowel prep for your procedure, you may not have a normal bowel movement for a few days.  Please Note:  You might notice some irritation and congestion in your nose or some drainage.  This is from the oxygen used during your procedure.  There is no need for concern and it should clear up in a day or so.  SYMPTOMS TO REPORT IMMEDIATELY:  Following lower endoscopy (colonoscopy or flexible sigmoidoscopy):  Excessive amounts of blood in the stool  Significant tenderness or worsening of abdominal pains  Swelling of the abdomen that is new, acute  Fever of 100F or higher  For urgent or emergent issues, a gastroenterologist can be reached at any hour by calling [phone]. Do not use MyChart messaging for urgent concerns.    DIET:  We do recommend a small meal at first, but then you may proceed to your regular diet.  Drink plenty of fluids but you should avoid alcoholic beverages for 24 hours.  ACTIVITY:  You  should plan to take it easy for the rest of today and you should NOT DRIVE or use heavy machinery until tomorrow (because of the sedation medicines used during the test).    FOLLOW UP: Our staff will call the number listed on your records the next business day following your procedure.  We will call around 7:15- 8:00 am to check on you and address any questions or concerns that you may have regarding the information given to you following your procedure. If we do not reach you, we will leave a message.     If any biopsies were taken you will be contacted by phone or by letter within the next 1-3 weeks.  Please call us  at [phone] if you have not heard about the biopsies in 3 weeks.    SIGNATURES/CONFIDENTIALITY: You and/or your care partner have signed paperwork which will be entered into your electronic medical record.  These signatures attest to the fact that that the information above on your After Visit Summary has been reviewed and is understood.  Full responsibility of the confidentiality of this discharge information lies with you and/or your care-partner.

## 2024-10-14 NOTE — Progress Notes (Addendum)
 Pt endorses numbness in a 2inch by 2inch area surrounding IV injection site and says IV feels strange. Pt denies pain/tingling/numbness throughout rest of hand or up arm. Hand grip and bilateral sensation normal. IV flows to gravity with brisk blood return. Discussed with pt the option of placing new IV for comfort. Pt expressed wish to keep current IV for procedure.

## 2024-10-14 NOTE — Progress Notes (Signed)
 Report to PACU, RN, vss, BBS= Clear.

## 2024-10-14 NOTE — Progress Notes (Signed)
Pt. states no medical or surgical changes since previsit or office visit. 

## 2024-10-14 NOTE — Progress Notes (Signed)
 Called to room to assist during endoscopic procedure.  Patient ID and intended procedure confirmed with present staff. Received instructions for my participation in the procedure from the performing physician.

## 2024-10-14 NOTE — Op Note (Signed)
 [facility] Patient Name: Deborah Cline Procedure Date: 10/14/2024 12:38 PM MRN: [id number] Endoscopist: Inocente Hausen , MD, [id number] Age: 24 Referring MD:  Date of Birth: 2001/04/14 Gender: Female Account #: [id number] Procedure:                Colonoscopy Indications:              Abdominal pain in the left lower quadrant, Rectal                            pain, Anal fissure, Perianal skin tag - concern for                            possible Crohn's disease Medicines:                Monitored Anesthesia Care Procedure:                Pre-Anesthesia Assessment:                           - Prior to the procedure, a History and Physical                            was performed, and patient medications and                            allergies were reviewed. The patient's tolerance of                            previous anesthesia was also reviewed. The risks                            and benefits of the procedure and the sedation                            options and risks were discussed with the patient.                            All questions were answered, and informed consent                            was obtained. Prior Anticoagulants: The patient has                            taken no anticoagulant or antiplatelet agents. ASA                            Grade Assessment: II - A patient with mild systemic                            disease. After reviewing the risks and benefits,                            the patient was deemed in satisfactory condition to  undergo the procedure.                           After obtaining informed consent, the colonoscope                            was passed under direct vision. Throughout the                            procedure, the patient's blood pressure, pulse, and                            oxygen saturations were monitored continuously. The                            Olympus Scope [id number] was  introduced through the                            anus and advanced to the 15 cm into the ileum. The                            colonoscopy was performed without difficulty. The                            patient tolerated the procedure well. The quality                            of the bowel preparation was good. The terminal                            ileum, ileocecal valve, appendiceal orifice, and                            rectum were photographed. Scope In: 1:44:28 PM Scope Out: 2:03:32 PM Scope Withdrawal Time: 0 hours 14 minutes 2 seconds  Total Procedure Duration: 0 hours 19 minutes 4 seconds  Findings:                 The perianal exam findings include anal fissure and                            skin tags.                           The digital rectal exam was normal. Pertinent                            negatives include normal sphincter tone and no                            palpable rectal lesions.                           Normal mucosa was found in the entire colon.  Biopsies were taken with a cold forceps for                            histology.                           The terminal ileum appeared normal. Biopsies were                            taken with a cold forceps for histology.                           The retroflexed view of the distal rectum and anal                            verge was normal and showed no anal or rectal                            abnormalities. Complications:            No immediate complications. Estimated blood loss:                            Minimal. Estimated Blood Loss:     Estimated blood loss was minimal. Impression:               - Anal fissure and perianal skin tags found on                            perianal exam.                           - Normal mucosa in the entire examined colon.                            Biopsied.                           - The examined portion of the ileum was normal.                             Biopsied.                           - The distal rectum and anal verge are normal on                            retroflexion view. Recommendation:           - Discharge patient to home (ambulatory).                           - Await pathology results.                           - Pending pathology results consider MRE with  perianal protocol +/- IBD serologies                           - The findings and recommendations were discussed                            with the patient's family.                           - Patient has a contact number available for                            emergencies. The signs and symptoms of potential                            delayed complications were discussed with the                            patient. Return to normal activities tomorrow.                            Written discharge instructions were provided to the                            patient. Inocente Hausen, MD 10/14/2024 2:10:42 PM This report has been signed electronically.

## 2024-10-15 ENCOUNTER — Telehealth: Payer: Self-pay

## 2024-10-15 NOTE — Telephone Encounter (Signed)
No answer on follow up call. 

## 2024-10-19 ENCOUNTER — Ambulatory Visit: Payer: Self-pay | Admitting: Pediatrics

## 2024-10-19 LAB — SURGICAL PATHOLOGY
